# Patient Record
Sex: Male | Born: 1935 | Race: White | Hispanic: No | Marital: Married | Smoking: Former smoker
Health system: Southern US, Community
[De-identification: ages and names within clinical notes are randomized; demographics above are authoritative.]

## PROBLEM LIST (undated history)

## (undated) DIAGNOSIS — I714 Abdominal aortic aneurysm, without rupture, unspecified: Secondary | ICD-10-CM

## (undated) DIAGNOSIS — Z7901 Long term (current) use of anticoagulants: Secondary | ICD-10-CM

## (undated) DIAGNOSIS — I10 Essential (primary) hypertension: Secondary | ICD-10-CM

## (undated) DIAGNOSIS — M069 Rheumatoid arthritis, unspecified: Secondary | ICD-10-CM

## (undated) DIAGNOSIS — I4821 Permanent atrial fibrillation: Secondary | ICD-10-CM

## (undated) DIAGNOSIS — K259 Gastric ulcer, unspecified as acute or chronic, without hemorrhage or perforation: Secondary | ICD-10-CM

## (undated) DIAGNOSIS — I099 Rheumatic heart disease, unspecified: Secondary | ICD-10-CM

## (undated) DIAGNOSIS — K8309 Other cholangitis: Secondary | ICD-10-CM

## (undated) DIAGNOSIS — I639 Cerebral infarction, unspecified: Secondary | ICD-10-CM

## (undated) DIAGNOSIS — I519 Heart disease, unspecified: Secondary | ICD-10-CM

## (undated) HISTORY — DX: Essential (primary) hypertension: I10

## (undated) HISTORY — DX: Abdominal aortic aneurysm, without rupture, unspecified: I71.40

## (undated) HISTORY — DX: Other cholangitis: K83.09

## (undated) HISTORY — DX: Permanent atrial fibrillation: I48.21

## (undated) HISTORY — DX: Long term (current) use of anticoagulants: Z79.01

## (undated) HISTORY — DX: Gastric ulcer, unspecified as acute or chronic, without hemorrhage or perforation: K25.9

## (undated) HISTORY — DX: Abdominal aortic aneurysm, without rupture: I71.4

## (undated) HISTORY — DX: Rheumatic heart disease, unspecified: I09.9

## (undated) HISTORY — DX: Rheumatoid arthritis, unspecified: M06.9

## (undated) HISTORY — DX: Heart disease, unspecified: I51.9

---

## 1999-06-01 ENCOUNTER — Inpatient Hospital Stay (HOSPITAL_COMMUNITY): Admission: EM | Admit: 1999-06-01 | Discharge: 1999-06-04 | Payer: Self-pay | Admitting: Emergency Medicine

## 1999-06-01 ENCOUNTER — Encounter: Payer: Self-pay | Admitting: Emergency Medicine

## 1999-07-30 ENCOUNTER — Ambulatory Visit (HOSPITAL_COMMUNITY): Admission: RE | Admit: 1999-07-30 | Discharge: 1999-07-30 | Payer: Self-pay

## 2001-08-06 ENCOUNTER — Ambulatory Visit (HOSPITAL_COMMUNITY): Admission: RE | Admit: 2001-08-06 | Discharge: 2001-08-06 | Payer: Self-pay | Admitting: *Deleted

## 2001-08-06 ENCOUNTER — Encounter: Payer: Self-pay | Admitting: *Deleted

## 2001-08-09 ENCOUNTER — Ambulatory Visit (HOSPITAL_COMMUNITY): Admission: RE | Admit: 2001-08-09 | Discharge: 2001-08-09 | Payer: Self-pay | Admitting: *Deleted

## 2001-09-06 ENCOUNTER — Ambulatory Visit (HOSPITAL_COMMUNITY): Admission: RE | Admit: 2001-09-06 | Discharge: 2001-09-06 | Payer: Self-pay | Admitting: Cardiology

## 2002-05-13 ENCOUNTER — Ambulatory Visit (HOSPITAL_COMMUNITY): Admission: RE | Admit: 2002-05-13 | Discharge: 2002-05-13 | Payer: Self-pay | Admitting: *Deleted

## 2002-05-13 ENCOUNTER — Encounter: Payer: Self-pay | Admitting: *Deleted

## 2002-09-07 ENCOUNTER — Encounter: Payer: Self-pay | Admitting: Internal Medicine

## 2002-09-07 ENCOUNTER — Ambulatory Visit (HOSPITAL_COMMUNITY): Admission: RE | Admit: 2002-09-07 | Discharge: 2002-09-07 | Payer: Self-pay | Admitting: Internal Medicine

## 2002-09-15 ENCOUNTER — Ambulatory Visit (HOSPITAL_COMMUNITY): Admission: RE | Admit: 2002-09-15 | Discharge: 2002-09-15 | Payer: Self-pay | Admitting: Internal Medicine

## 2002-09-15 ENCOUNTER — Encounter: Payer: Self-pay | Admitting: Internal Medicine

## 2003-01-10 ENCOUNTER — Ambulatory Visit (HOSPITAL_COMMUNITY): Admission: RE | Admit: 2003-01-10 | Discharge: 2003-01-10 | Payer: Self-pay | Admitting: Internal Medicine

## 2003-01-10 ENCOUNTER — Encounter: Payer: Self-pay | Admitting: Internal Medicine

## 2003-07-17 ENCOUNTER — Encounter: Payer: Self-pay | Admitting: Internal Medicine

## 2003-07-17 ENCOUNTER — Ambulatory Visit (HOSPITAL_COMMUNITY): Admission: RE | Admit: 2003-07-17 | Discharge: 2003-07-17 | Payer: Self-pay | Admitting: Internal Medicine

## 2003-08-11 ENCOUNTER — Ambulatory Visit (HOSPITAL_COMMUNITY): Admission: RE | Admit: 2003-08-11 | Discharge: 2003-08-11 | Payer: Self-pay | Admitting: *Deleted

## 2003-08-11 ENCOUNTER — Encounter: Payer: Self-pay | Admitting: *Deleted

## 2003-11-02 ENCOUNTER — Ambulatory Visit (HOSPITAL_COMMUNITY): Admission: RE | Admit: 2003-11-02 | Discharge: 2003-11-02 | Payer: Self-pay | Admitting: Internal Medicine

## 2004-05-08 ENCOUNTER — Ambulatory Visit (HOSPITAL_COMMUNITY): Admission: RE | Admit: 2004-05-08 | Discharge: 2004-05-08 | Payer: Self-pay | Admitting: Internal Medicine

## 2004-06-17 ENCOUNTER — Ambulatory Visit (HOSPITAL_COMMUNITY): Admission: RE | Admit: 2004-06-17 | Discharge: 2004-06-17 | Payer: Self-pay | Admitting: *Deleted

## 2004-10-16 ENCOUNTER — Ambulatory Visit: Payer: Self-pay | Admitting: *Deleted

## 2004-11-12 ENCOUNTER — Ambulatory Visit (HOSPITAL_COMMUNITY): Admission: RE | Admit: 2004-11-12 | Discharge: 2004-11-12 | Payer: Self-pay | Admitting: Internal Medicine

## 2004-11-15 ENCOUNTER — Ambulatory Visit: Payer: Self-pay | Admitting: *Deleted

## 2004-11-26 ENCOUNTER — Ambulatory Visit: Payer: Self-pay | Admitting: *Deleted

## 2004-12-18 ENCOUNTER — Ambulatory Visit: Payer: Self-pay | Admitting: *Deleted

## 2004-12-30 ENCOUNTER — Ambulatory Visit (HOSPITAL_COMMUNITY): Admission: RE | Admit: 2004-12-30 | Discharge: 2004-12-30 | Payer: Self-pay | Admitting: Ophthalmology

## 2005-01-15 ENCOUNTER — Ambulatory Visit: Payer: Self-pay | Admitting: *Deleted

## 2005-02-12 ENCOUNTER — Ambulatory Visit: Payer: Self-pay | Admitting: Cardiovascular Disease

## 2005-03-18 ENCOUNTER — Ambulatory Visit: Payer: Self-pay | Admitting: *Deleted

## 2005-04-09 ENCOUNTER — Ambulatory Visit: Payer: Self-pay | Admitting: *Deleted

## 2005-04-30 ENCOUNTER — Ambulatory Visit: Payer: Self-pay | Admitting: *Deleted

## 2005-06-02 ENCOUNTER — Ambulatory Visit: Payer: Self-pay | Admitting: Cardiology

## 2005-07-01 ENCOUNTER — Ambulatory Visit: Payer: Self-pay | Admitting: *Deleted

## 2005-07-30 ENCOUNTER — Ambulatory Visit: Payer: Self-pay | Admitting: Cardiology

## 2005-08-27 ENCOUNTER — Ambulatory Visit: Payer: Self-pay | Admitting: *Deleted

## 2005-08-28 ENCOUNTER — Ambulatory Visit: Payer: Self-pay | Admitting: Cardiology

## 2005-08-28 ENCOUNTER — Ambulatory Visit (HOSPITAL_COMMUNITY): Admission: RE | Admit: 2005-08-28 | Discharge: 2005-08-28 | Payer: Self-pay | Admitting: *Deleted

## 2005-10-07 ENCOUNTER — Ambulatory Visit: Payer: Self-pay | Admitting: *Deleted

## 2005-11-04 ENCOUNTER — Ambulatory Visit: Payer: Self-pay | Admitting: *Deleted

## 2005-12-08 ENCOUNTER — Ambulatory Visit: Payer: Self-pay | Admitting: *Deleted

## 2006-01-07 ENCOUNTER — Ambulatory Visit: Payer: Self-pay | Admitting: *Deleted

## 2006-01-28 ENCOUNTER — Ambulatory Visit: Payer: Self-pay | Admitting: *Deleted

## 2006-02-26 ENCOUNTER — Ambulatory Visit: Payer: Self-pay | Admitting: *Deleted

## 2006-03-31 ENCOUNTER — Ambulatory Visit: Payer: Self-pay | Admitting: *Deleted

## 2006-04-29 ENCOUNTER — Ambulatory Visit: Payer: Self-pay | Admitting: *Deleted

## 2006-05-29 ENCOUNTER — Ambulatory Visit: Payer: Self-pay | Admitting: *Deleted

## 2006-06-30 ENCOUNTER — Ambulatory Visit: Payer: Self-pay | Admitting: *Deleted

## 2006-08-04 ENCOUNTER — Ambulatory Visit: Payer: Self-pay | Admitting: Cardiology

## 2006-08-19 ENCOUNTER — Ambulatory Visit: Payer: Self-pay | Admitting: Cardiology

## 2006-09-22 ENCOUNTER — Ambulatory Visit: Payer: Self-pay | Admitting: *Deleted

## 2006-10-23 ENCOUNTER — Ambulatory Visit: Payer: Self-pay | Admitting: Cardiology

## 2006-10-23 ENCOUNTER — Ambulatory Visit (HOSPITAL_COMMUNITY): Admission: RE | Admit: 2006-10-23 | Discharge: 2006-10-23 | Payer: Self-pay | Admitting: *Deleted

## 2006-11-20 ENCOUNTER — Ambulatory Visit: Payer: Self-pay | Admitting: Cardiology

## 2006-12-29 ENCOUNTER — Ambulatory Visit: Payer: Self-pay | Admitting: Cardiology

## 2007-01-29 ENCOUNTER — Ambulatory Visit: Payer: Self-pay | Admitting: Cardiology

## 2007-02-12 ENCOUNTER — Ambulatory Visit: Payer: Self-pay | Admitting: Internal Medicine

## 2007-03-03 ENCOUNTER — Ambulatory Visit: Payer: Self-pay | Admitting: *Deleted

## 2007-03-10 ENCOUNTER — Ambulatory Visit: Payer: Self-pay | Admitting: Cardiology

## 2007-03-25 ENCOUNTER — Ambulatory Visit: Payer: Self-pay | Admitting: Cardiology

## 2007-04-08 ENCOUNTER — Ambulatory Visit: Payer: Self-pay | Admitting: Cardiology

## 2007-04-22 ENCOUNTER — Ambulatory Visit: Payer: Self-pay | Admitting: Cardiology

## 2007-05-27 ENCOUNTER — Ambulatory Visit: Payer: Self-pay | Admitting: Internal Medicine

## 2007-06-10 ENCOUNTER — Ambulatory Visit: Payer: Self-pay | Admitting: Physician Assistant

## 2007-06-24 ENCOUNTER — Ambulatory Visit: Payer: Self-pay | Admitting: Cardiology

## 2007-07-13 ENCOUNTER — Ambulatory Visit (HOSPITAL_COMMUNITY): Admission: RE | Admit: 2007-07-13 | Discharge: 2007-07-13 | Payer: Self-pay | Admitting: Internal Medicine

## 2007-07-20 ENCOUNTER — Ambulatory Visit: Payer: Self-pay | Admitting: Cardiology

## 2007-08-03 ENCOUNTER — Ambulatory Visit: Payer: Self-pay | Admitting: Cardiology

## 2007-08-11 ENCOUNTER — Ambulatory Visit: Payer: Self-pay | Admitting: Cardiology

## 2007-08-25 ENCOUNTER — Ambulatory Visit: Payer: Self-pay | Admitting: Cardiology

## 2007-09-08 ENCOUNTER — Ambulatory Visit: Payer: Self-pay | Admitting: Cardiology

## 2007-09-10 ENCOUNTER — Ambulatory Visit (HOSPITAL_COMMUNITY): Admission: RE | Admit: 2007-09-10 | Discharge: 2007-09-10 | Payer: Self-pay | Admitting: Internal Medicine

## 2007-09-29 ENCOUNTER — Ambulatory Visit: Payer: Self-pay | Admitting: Cardiology

## 2007-10-20 ENCOUNTER — Ambulatory Visit: Payer: Self-pay | Admitting: Cardiology

## 2007-11-17 ENCOUNTER — Ambulatory Visit: Payer: Self-pay | Admitting: Cardiology

## 2007-12-15 ENCOUNTER — Ambulatory Visit: Payer: Self-pay | Admitting: Cardiology

## 2007-12-17 ENCOUNTER — Ambulatory Visit: Payer: Self-pay | Admitting: Cardiology

## 2008-01-03 ENCOUNTER — Ambulatory Visit: Payer: Self-pay | Admitting: Cardiology

## 2008-01-11 ENCOUNTER — Ambulatory Visit: Payer: Self-pay | Admitting: Cardiology

## 2008-01-28 ENCOUNTER — Ambulatory Visit: Payer: Self-pay | Admitting: Cardiology

## 2008-02-08 ENCOUNTER — Ambulatory Visit: Payer: Self-pay | Admitting: Cardiology

## 2008-02-15 ENCOUNTER — Ambulatory Visit: Payer: Self-pay | Admitting: Cardiology

## 2008-02-29 ENCOUNTER — Ambulatory Visit: Payer: Self-pay | Admitting: Cardiology

## 2008-03-13 ENCOUNTER — Ambulatory Visit: Payer: Self-pay | Admitting: Cardiology

## 2008-03-14 ENCOUNTER — Ambulatory Visit: Payer: Self-pay | Admitting: Cardiology

## 2008-03-15 ENCOUNTER — Encounter: Payer: Self-pay | Admitting: Cardiology

## 2008-03-20 ENCOUNTER — Ambulatory Visit: Payer: Self-pay | Admitting: Cardiology

## 2008-03-24 ENCOUNTER — Encounter: Payer: Self-pay | Admitting: Cardiology

## 2008-03-27 ENCOUNTER — Ambulatory Visit: Payer: Self-pay | Admitting: Cardiology

## 2008-03-28 ENCOUNTER — Encounter: Payer: Self-pay | Admitting: Cardiology

## 2008-04-04 ENCOUNTER — Ambulatory Visit: Payer: Self-pay | Admitting: Cardiology

## 2008-04-07 ENCOUNTER — Ambulatory Visit: Payer: Self-pay | Admitting: Cardiology

## 2008-04-07 ENCOUNTER — Encounter: Payer: Self-pay | Admitting: Physician Assistant

## 2008-04-10 ENCOUNTER — Ambulatory Visit: Payer: Self-pay | Admitting: Cardiology

## 2008-04-20 ENCOUNTER — Ambulatory Visit: Payer: Self-pay | Admitting: Cardiology

## 2008-04-28 ENCOUNTER — Ambulatory Visit: Payer: Self-pay | Admitting: Cardiology

## 2008-05-05 ENCOUNTER — Ambulatory Visit: Payer: Self-pay | Admitting: Cardiology

## 2008-05-12 ENCOUNTER — Ambulatory Visit: Payer: Self-pay | Admitting: Cardiology

## 2008-05-19 ENCOUNTER — Ambulatory Visit: Payer: Self-pay | Admitting: Cardiology

## 2008-06-01 ENCOUNTER — Ambulatory Visit: Payer: Self-pay | Admitting: Cardiology

## 2008-06-12 ENCOUNTER — Encounter: Payer: Self-pay | Admitting: Cardiology

## 2008-06-23 ENCOUNTER — Ambulatory Visit: Payer: Self-pay | Admitting: Cardiology

## 2008-07-04 ENCOUNTER — Ambulatory Visit: Payer: Self-pay | Admitting: Cardiology

## 2008-07-11 ENCOUNTER — Ambulatory Visit: Payer: Self-pay | Admitting: Cardiology

## 2008-07-28 ENCOUNTER — Ambulatory Visit: Payer: Self-pay | Admitting: Cardiology

## 2008-08-02 ENCOUNTER — Ambulatory Visit: Payer: Self-pay | Admitting: Cardiology

## 2008-08-16 ENCOUNTER — Ambulatory Visit: Payer: Self-pay | Admitting: Cardiology

## 2008-09-01 ENCOUNTER — Ambulatory Visit: Payer: Self-pay | Admitting: Cardiology

## 2008-09-04 ENCOUNTER — Ambulatory Visit: Payer: Self-pay | Admitting: Cardiology

## 2008-09-06 ENCOUNTER — Ambulatory Visit: Payer: Self-pay | Admitting: Cardiology

## 2008-09-08 ENCOUNTER — Ambulatory Visit: Payer: Self-pay | Admitting: Cardiology

## 2008-09-15 ENCOUNTER — Ambulatory Visit: Payer: Self-pay | Admitting: Cardiology

## 2008-09-29 ENCOUNTER — Ambulatory Visit: Payer: Self-pay | Admitting: Cardiology

## 2008-10-10 ENCOUNTER — Ambulatory Visit: Payer: Self-pay | Admitting: Cardiology

## 2008-10-16 ENCOUNTER — Encounter: Payer: Self-pay | Admitting: Cardiology

## 2008-10-16 ENCOUNTER — Ambulatory Visit: Payer: Self-pay | Admitting: Cardiology

## 2008-10-16 DIAGNOSIS — F411 Generalized anxiety disorder: Secondary | ICD-10-CM | POA: Insufficient documentation

## 2008-10-16 DIAGNOSIS — Z8679 Personal history of other diseases of the circulatory system: Secondary | ICD-10-CM

## 2008-10-16 DIAGNOSIS — I099 Rheumatic heart disease, unspecified: Secondary | ICD-10-CM

## 2008-10-16 DIAGNOSIS — Z954 Presence of other heart-valve replacement: Secondary | ICD-10-CM

## 2008-10-16 DIAGNOSIS — F3289 Other specified depressive episodes: Secondary | ICD-10-CM

## 2008-10-16 DIAGNOSIS — I509 Heart failure, unspecified: Secondary | ICD-10-CM | POA: Insufficient documentation

## 2008-10-16 DIAGNOSIS — Z9889 Other specified postprocedural states: Secondary | ICD-10-CM | POA: Insufficient documentation

## 2008-10-16 DIAGNOSIS — I6350 Cerebral infarction due to unspecified occlusion or stenosis of unspecified cerebral artery: Secondary | ICD-10-CM | POA: Insufficient documentation

## 2008-10-24 ENCOUNTER — Ambulatory Visit: Payer: Self-pay | Admitting: Cardiology

## 2008-11-07 ENCOUNTER — Ambulatory Visit: Payer: Self-pay | Admitting: Cardiology

## 2008-11-21 ENCOUNTER — Ambulatory Visit: Payer: Self-pay | Admitting: Cardiology

## 2008-12-05 ENCOUNTER — Ambulatory Visit: Payer: Self-pay | Admitting: Cardiology

## 2008-12-19 ENCOUNTER — Ambulatory Visit: Payer: Self-pay | Admitting: Cardiology

## 2009-01-09 ENCOUNTER — Ambulatory Visit: Payer: Self-pay | Admitting: Cardiology

## 2009-01-23 ENCOUNTER — Ambulatory Visit: Payer: Self-pay | Admitting: Cardiology

## 2009-02-06 ENCOUNTER — Ambulatory Visit: Payer: Self-pay | Admitting: Cardiology

## 2009-02-23 ENCOUNTER — Ambulatory Visit: Payer: Self-pay | Admitting: Cardiology

## 2009-03-09 ENCOUNTER — Ambulatory Visit: Payer: Self-pay | Admitting: Cardiology

## 2009-03-14 ENCOUNTER — Encounter: Payer: Self-pay | Admitting: Cardiology

## 2009-03-23 ENCOUNTER — Ambulatory Visit: Payer: Self-pay | Admitting: Cardiology

## 2009-03-27 ENCOUNTER — Ambulatory Visit: Payer: Self-pay | Admitting: Cardiology

## 2009-03-30 ENCOUNTER — Ambulatory Visit: Payer: Self-pay | Admitting: Cardiology

## 2009-04-03 ENCOUNTER — Ambulatory Visit: Payer: Self-pay | Admitting: Cardiology

## 2009-04-10 ENCOUNTER — Ambulatory Visit: Payer: Self-pay | Admitting: Cardiology

## 2009-04-26 ENCOUNTER — Ambulatory Visit: Payer: Self-pay | Admitting: Cardiology

## 2009-05-08 ENCOUNTER — Ambulatory Visit: Payer: Self-pay | Admitting: Cardiology

## 2009-05-25 ENCOUNTER — Ambulatory Visit: Payer: Self-pay

## 2009-06-05 ENCOUNTER — Ambulatory Visit: Payer: Self-pay | Admitting: Cardiology

## 2009-06-26 ENCOUNTER — Ambulatory Visit: Payer: Self-pay | Admitting: Cardiology

## 2009-07-10 ENCOUNTER — Ambulatory Visit: Payer: Self-pay | Admitting: Cardiology

## 2009-07-16 ENCOUNTER — Encounter: Payer: Self-pay | Admitting: *Deleted

## 2009-07-17 ENCOUNTER — Ambulatory Visit: Payer: Self-pay | Admitting: Cardiology

## 2009-07-17 LAB — CONVERTED CEMR LAB: Prothrombin Time: 23.8 s

## 2009-07-31 ENCOUNTER — Ambulatory Visit: Payer: Self-pay | Admitting: Cardiology

## 2009-08-14 ENCOUNTER — Ambulatory Visit: Payer: Self-pay | Admitting: Cardiology

## 2009-08-14 LAB — CONVERTED CEMR LAB: POC INR: 4.4

## 2009-08-28 ENCOUNTER — Ambulatory Visit: Payer: Self-pay | Admitting: Cardiology

## 2009-08-28 LAB — CONVERTED CEMR LAB: POC INR: 4.9

## 2009-09-11 ENCOUNTER — Ambulatory Visit: Payer: Self-pay | Admitting: Cardiology

## 2009-09-11 LAB — CONVERTED CEMR LAB: POC INR: 4.6

## 2009-09-28 ENCOUNTER — Ambulatory Visit: Payer: Self-pay | Admitting: Cardiology

## 2009-09-28 ENCOUNTER — Encounter: Payer: Self-pay | Admitting: Physician Assistant

## 2009-10-12 ENCOUNTER — Ambulatory Visit: Payer: Self-pay | Admitting: Cardiology

## 2009-10-12 LAB — CONVERTED CEMR LAB: POC INR: 3.7

## 2009-10-30 ENCOUNTER — Ambulatory Visit: Payer: Self-pay | Admitting: Cardiology

## 2009-10-30 LAB — CONVERTED CEMR LAB: POC INR: 3.3

## 2009-11-09 ENCOUNTER — Ambulatory Visit: Payer: Self-pay | Admitting: Cardiology

## 2009-11-12 ENCOUNTER — Ambulatory Visit: Payer: Self-pay | Admitting: Cardiology

## 2009-11-27 ENCOUNTER — Ambulatory Visit: Payer: Self-pay | Admitting: Cardiology

## 2009-12-18 ENCOUNTER — Ambulatory Visit: Payer: Self-pay | Admitting: Cardiology

## 2009-12-18 LAB — CONVERTED CEMR LAB: POC INR: 4.6

## 2010-01-04 ENCOUNTER — Ambulatory Visit: Payer: Self-pay | Admitting: Cardiology

## 2010-01-25 ENCOUNTER — Ambulatory Visit: Payer: Self-pay | Admitting: Cardiology

## 2010-02-08 ENCOUNTER — Encounter (INDEPENDENT_AMBULATORY_CARE_PROVIDER_SITE_OTHER): Payer: Self-pay | Admitting: *Deleted

## 2010-02-08 ENCOUNTER — Ambulatory Visit: Payer: Self-pay | Admitting: Cardiology

## 2010-02-08 ENCOUNTER — Encounter: Payer: Self-pay | Admitting: Cardiology

## 2010-02-08 LAB — CONVERTED CEMR LAB: POC INR: 4.1

## 2010-02-18 ENCOUNTER — Ambulatory Visit: Payer: Self-pay | Admitting: Cardiology

## 2010-02-18 ENCOUNTER — Encounter: Payer: Self-pay | Admitting: Cardiology

## 2010-03-01 ENCOUNTER — Ambulatory Visit: Payer: Self-pay | Admitting: Cardiology

## 2010-03-01 LAB — CONVERTED CEMR LAB: POC INR: 3.9

## 2010-03-19 ENCOUNTER — Ambulatory Visit: Payer: Self-pay | Admitting: Cardiology

## 2010-03-19 LAB — CONVERTED CEMR LAB: POC INR: 3.7

## 2010-04-09 ENCOUNTER — Ambulatory Visit: Payer: Self-pay | Admitting: Cardiology

## 2010-04-30 ENCOUNTER — Ambulatory Visit: Payer: Self-pay | Admitting: Cardiology

## 2010-04-30 LAB — CONVERTED CEMR LAB: POC INR: 3.1

## 2010-05-08 ENCOUNTER — Ambulatory Visit: Payer: Self-pay | Admitting: Cardiology

## 2010-05-10 ENCOUNTER — Encounter: Payer: Self-pay | Admitting: Cardiology

## 2010-05-17 ENCOUNTER — Ambulatory Visit: Payer: Self-pay | Admitting: Cardiology

## 2010-05-17 LAB — CONVERTED CEMR LAB: POC INR: 3.2

## 2010-05-31 ENCOUNTER — Ambulatory Visit: Payer: Self-pay | Admitting: Cardiology

## 2010-06-21 ENCOUNTER — Ambulatory Visit: Payer: Self-pay | Admitting: Cardiology

## 2010-06-21 ENCOUNTER — Telehealth: Payer: Self-pay | Admitting: Adult Health

## 2010-06-25 ENCOUNTER — Ambulatory Visit: Payer: Self-pay | Admitting: Cardiology

## 2010-06-25 LAB — CONVERTED CEMR LAB: POC INR: 3.2

## 2010-07-01 ENCOUNTER — Telehealth (INDEPENDENT_AMBULATORY_CARE_PROVIDER_SITE_OTHER): Payer: Self-pay | Admitting: *Deleted

## 2010-07-02 ENCOUNTER — Ambulatory Visit (HOSPITAL_COMMUNITY): Admission: RE | Admit: 2010-07-02 | Discharge: 2010-07-02 | Payer: Self-pay | Admitting: Internal Medicine

## 2010-07-02 ENCOUNTER — Encounter: Payer: Self-pay | Admitting: Cardiology

## 2010-07-03 ENCOUNTER — Ambulatory Visit: Payer: Self-pay | Admitting: Cardiology

## 2010-07-03 DIAGNOSIS — R079 Chest pain, unspecified: Secondary | ICD-10-CM | POA: Insufficient documentation

## 2010-07-03 DIAGNOSIS — R310 Gross hematuria: Secondary | ICD-10-CM | POA: Insufficient documentation

## 2010-07-12 ENCOUNTER — Ambulatory Visit: Payer: Self-pay | Admitting: Cardiology

## 2010-07-12 LAB — CONVERTED CEMR LAB: POC INR: 3.3

## 2010-07-26 ENCOUNTER — Ambulatory Visit: Payer: Self-pay | Admitting: Cardiology

## 2010-08-16 ENCOUNTER — Ambulatory Visit: Payer: Self-pay | Admitting: Cardiology

## 2010-08-16 LAB — CONVERTED CEMR LAB: POC INR: 4.7

## 2010-08-30 ENCOUNTER — Ambulatory Visit: Payer: Self-pay | Admitting: Cardiology

## 2010-08-30 LAB — CONVERTED CEMR LAB: POC INR: 4.6

## 2010-09-17 ENCOUNTER — Ambulatory Visit: Payer: Self-pay | Admitting: Cardiology

## 2010-09-17 LAB — CONVERTED CEMR LAB: POC INR: 5

## 2010-10-01 ENCOUNTER — Ambulatory Visit: Payer: Self-pay | Admitting: Cardiology

## 2010-10-22 ENCOUNTER — Ambulatory Visit: Payer: Self-pay | Admitting: Cardiology

## 2010-11-08 ENCOUNTER — Ambulatory Visit: Payer: Self-pay | Admitting: Cardiology

## 2010-11-08 LAB — CONVERTED CEMR LAB: POC INR: 4.8

## 2010-11-29 ENCOUNTER — Ambulatory Visit: Payer: Self-pay | Admitting: Cardiology

## 2010-11-29 LAB — CONVERTED CEMR LAB: POC INR: 7.6

## 2010-12-03 ENCOUNTER — Ambulatory Visit: Admission: RE | Admit: 2010-12-03 | Discharge: 2010-12-03 | Payer: Self-pay | Source: Home / Self Care

## 2010-12-04 ENCOUNTER — Telehealth (INDEPENDENT_AMBULATORY_CARE_PROVIDER_SITE_OTHER): Payer: Self-pay | Admitting: *Deleted

## 2010-12-04 ENCOUNTER — Encounter: Payer: Self-pay | Admitting: Cardiology

## 2010-12-07 ENCOUNTER — Encounter: Payer: Self-pay | Admitting: Cardiology

## 2010-12-09 ENCOUNTER — Ambulatory Visit: Admit: 2010-12-09 | Payer: Self-pay

## 2010-12-10 ENCOUNTER — Ambulatory Visit
Admission: RE | Admit: 2010-12-10 | Discharge: 2010-12-10 | Payer: Self-pay | Source: Home / Self Care | Attending: Cardiology | Admitting: Cardiology

## 2010-12-10 LAB — CONVERTED CEMR LAB: POC INR: 3.8

## 2010-12-12 ENCOUNTER — Ambulatory Visit: Admit: 2010-12-12 | Payer: Self-pay

## 2010-12-13 ENCOUNTER — Ambulatory Visit: Admit: 2010-12-13 | Payer: Self-pay

## 2010-12-13 ENCOUNTER — Encounter (INDEPENDENT_AMBULATORY_CARE_PROVIDER_SITE_OTHER): Payer: Self-pay | Admitting: *Deleted

## 2010-12-13 ENCOUNTER — Encounter: Payer: Self-pay | Admitting: Cardiology

## 2010-12-13 ENCOUNTER — Inpatient Hospital Stay (HOSPITAL_COMMUNITY)
Admission: EM | Admit: 2010-12-13 | Discharge: 2010-12-21 | Payer: Self-pay | Source: Home / Self Care | Attending: Internal Medicine | Admitting: Internal Medicine

## 2010-12-16 ENCOUNTER — Ambulatory Visit: Admit: 2010-12-16 | Payer: Self-pay | Admitting: Internal Medicine

## 2010-12-16 LAB — DIFFERENTIAL
Basophils Absolute: 0 10*3/uL (ref 0.0–0.1)
Basophils Absolute: 0 10*3/uL (ref 0.0–0.1)
Basophils Relative: 0 % (ref 0–1)
Basophils Relative: 0 % (ref 0–1)
Eosinophils Absolute: 0.2 10*3/uL (ref 0.0–0.7)
Eosinophils Absolute: 0.6 10*3/uL (ref 0.0–0.7)
Eosinophils Relative: 1 % (ref 0–5)
Eosinophils Relative: 4 % (ref 0–5)
Lymphocytes Relative: 3 % — ABNORMAL LOW (ref 12–46)
Lymphocytes Relative: 5 % — ABNORMAL LOW (ref 12–46)
Lymphs Abs: 0.6 10*3/uL — ABNORMAL LOW (ref 0.7–4.0)
Lymphs Abs: 0.7 10*3/uL (ref 0.7–4.0)
Monocytes Absolute: 0.9 10*3/uL (ref 0.1–1.0)
Monocytes Absolute: 0.9 10*3/uL (ref 0.1–1.0)
Monocytes Relative: 5 % (ref 3–12)
Monocytes Relative: 7 % (ref 3–12)
Neutro Abs: 17.2 10*3/uL — ABNORMAL HIGH (ref 1.7–7.7)
Neutro Abs: 11.9 10*3/uL — ABNORMAL HIGH (ref 1.7–7.7)
Neutrophils Relative %: 91 % — ABNORMAL HIGH (ref 43–77)
Neutrophils Relative %: 84 % — ABNORMAL HIGH (ref 43–77)

## 2010-12-16 LAB — CBC
HCT: 36.3 % — ABNORMAL LOW (ref 39.0–52.0)
HCT: 33.6 % — ABNORMAL LOW (ref 39.0–52.0)
HCT: 34.3 % — ABNORMAL LOW (ref 39.0–52.0)
Hemoglobin: 11.9 g/dL — ABNORMAL LOW (ref 13.0–17.0)
Hemoglobin: 11 g/dL — ABNORMAL LOW (ref 13.0–17.0)
Hemoglobin: 11 g/dL — ABNORMAL LOW (ref 13.0–17.0)
MCH: 31 pg (ref 26.0–34.0)
MCH: 31.1 pg (ref 26.0–34.0)
MCH: 30.7 pg (ref 26.0–34.0)
MCHC: 32.8 g/dL (ref 30.0–36.0)
MCHC: 32.7 g/dL (ref 30.0–36.0)
MCHC: 32.1 g/dL (ref 30.0–36.0)
MCV: 94.5 fL (ref 78.0–100.0)
MCV: 94.9 fL (ref 78.0–100.0)
MCV: 95.8 fL (ref 78.0–100.0)
Platelets: 271 10*3/uL (ref 150–400)
Platelets: 268 10*3/uL (ref 150–400)
Platelets: 305 10*3/uL (ref 150–400)
RBC: 3.84 MIL/uL — ABNORMAL LOW (ref 4.22–5.81)
RBC: 3.54 MIL/uL — ABNORMAL LOW (ref 4.22–5.81)
RBC: 3.58 MIL/uL — ABNORMAL LOW (ref 4.22–5.81)
RDW: 14.3 % (ref 11.5–15.5)
RDW: 14.4 % (ref 11.5–15.5)
RDW: 14.3 % (ref 11.5–15.5)
WBC: 18.8 10*3/uL — ABNORMAL HIGH (ref 4.0–10.5)
WBC: 14.1 10*3/uL — ABNORMAL HIGH (ref 4.0–10.5)
WBC: 8.6 10*3/uL (ref 4.0–10.5)

## 2010-12-16 LAB — COMPREHENSIVE METABOLIC PANEL
ALT: 104 U/L — ABNORMAL HIGH (ref 0–53)
ALT: 80 U/L — ABNORMAL HIGH (ref 0–53)
ALT: 60 U/L — ABNORMAL HIGH (ref 0–53)
AST: 152 U/L — ABNORMAL HIGH (ref 0–37)
AST: 100 U/L — ABNORMAL HIGH (ref 0–37)
AST: 55 U/L — ABNORMAL HIGH (ref 0–37)
Albumin: 3.3 g/dL — ABNORMAL LOW (ref 3.5–5.2)
Albumin: 3 g/dL — ABNORMAL LOW (ref 3.5–5.2)
Albumin: 3 g/dL — ABNORMAL LOW (ref 3.5–5.2)
Alkaline Phosphatase: 195 U/L — ABNORMAL HIGH (ref 39–117)
Alkaline Phosphatase: 157 U/L — ABNORMAL HIGH (ref 39–117)
Alkaline Phosphatase: 132 U/L — ABNORMAL HIGH (ref 39–117)
BUN: 14 mg/dL (ref 6–23)
BUN: 10 mg/dL (ref 6–23)
BUN: 6 mg/dL (ref 6–23)
CO2: 23 mEq/L (ref 19–32)
CO2: 24 mEq/L (ref 19–32)
CO2: 25 mEq/L (ref 19–32)
Calcium: 9.8 mg/dL (ref 8.4–10.5)
Calcium: 9.5 mg/dL (ref 8.4–10.5)
Calcium: 9.6 mg/dL (ref 8.4–10.5)
Chloride: 99 mEq/L (ref 96–112)
Chloride: 102 mEq/L (ref 96–112)
Chloride: 106 mEq/L (ref 96–112)
Creatinine, Ser: 0.91 mg/dL (ref 0.4–1.5)
Creatinine, Ser: 0.75 mg/dL (ref 0.4–1.5)
Creatinine, Ser: 0.74 mg/dL (ref 0.4–1.5)
GFR calc Af Amer: >60 mL/min (ref >60)
GFR calc Af Amer: >60 mL/min (ref >60)
GFR calc Af Amer: >60 mL/min (ref >60)
GFR calc non Af Amer: >60 mL/min (ref >60)
GFR calc non Af Amer: >60 mL/min (ref >60)
GFR calc non Af Amer: >60 mL/min (ref >60)
Glucose, Bld: 150 mg/dL — ABNORMAL HIGH (ref 70–99)
Glucose, Bld: 107 mg/dL — ABNORMAL HIGH (ref 70–99)
Glucose, Bld: 102 mg/dL — ABNORMAL HIGH (ref 70–99)
Potassium: 4.3 mEq/L (ref 3.5–5.1)
Potassium: 4 mEq/L (ref 3.5–5.1)
Potassium: 4.3 mEq/L (ref 3.5–5.1)
Sodium: 131 mEq/L — ABNORMAL LOW (ref 135–145)
Sodium: 133 mEq/L — ABNORMAL LOW (ref 135–145)
Sodium: 137 mEq/L (ref 135–145)
Total Bilirubin: 2.4 mg/dL — ABNORMAL HIGH (ref 0.3–1.2)
Total Bilirubin: 1.7 mg/dL — ABNORMAL HIGH (ref 0.3–1.2)
Total Bilirubin: 1.2 mg/dL (ref 0.3–1.2)
Total Protein: 6.4 g/dL (ref 6.0–8.3)
Total Protein: 5.9 g/dL — ABNORMAL LOW (ref 6.0–8.3)
Total Protein: 5.8 g/dL — ABNORMAL LOW (ref 6.0–8.3)

## 2010-12-16 LAB — URINE CULTURE
Colony Count: NO GROWTH
Culture  Setup Time: 201201131750
Culture: NO GROWTH

## 2010-12-16 LAB — URINALYSIS, ROUTINE W REFLEX MICROSCOPIC
Ketones, ur: NEGATIVE mg/dL
Nitrite: NEGATIVE
Protein, ur: NEGATIVE mg/dL
Specific Gravity, Urine: 1.018 (ref 1.005–1.030)
Urine Glucose, Fasting: NEGATIVE mg/dL
Urobilinogen, UA: 0.2 mg/dL (ref 0.0–1.0)
pH: 5.5 (ref 5.0–8.0)

## 2010-12-16 LAB — URINE MICROSCOPIC-ADD ON

## 2010-12-16 LAB — GLUCOSE, CAPILLARY
Glucose-Capillary: 181 mg/dL — ABNORMAL HIGH (ref 70–99)
Glucose-Capillary: 129 mg/dL — ABNORMAL HIGH (ref 70–99)
Glucose-Capillary: 118 mg/dL — ABNORMAL HIGH (ref 70–99)
Glucose-Capillary: 131 mg/dL — ABNORMAL HIGH (ref 70–99)
Glucose-Capillary: 127 mg/dL — ABNORMAL HIGH (ref 70–99)
Glucose-Capillary: 109 mg/dL — ABNORMAL HIGH (ref 70–99)
Glucose-Capillary: 151 mg/dL — ABNORMAL HIGH (ref 70–99)
Glucose-Capillary: 142 mg/dL — ABNORMAL HIGH (ref 70–99)
Glucose-Capillary: 114 mg/dL — ABNORMAL HIGH (ref 70–99)

## 2010-12-16 LAB — PROTIME-INR
INR: 3 — ABNORMAL HIGH (ref 0.00–1.49)
INR: 2.88 — ABNORMAL HIGH (ref 0.00–1.49)
INR: 1.66 — ABNORMAL HIGH (ref 0.00–1.49)
Prothrombin Time: 31.2 seconds — ABNORMAL HIGH (ref 11.6–15.2)
Prothrombin Time: 30.2 seconds — ABNORMAL HIGH (ref 11.6–15.2)
Prothrombin Time: 19.8 seconds — ABNORMAL HIGH (ref 11.6–15.2)

## 2010-12-16 LAB — HEPARIN LEVEL (UNFRACTIONATED)
Heparin Unfractionated: <0.1 IU/mL — ABNORMAL LOW (ref 0.30–0.70)
Heparin Unfractionated: 0.12 IU/mL — ABNORMAL LOW (ref 0.30–0.70)
Heparin Unfractionated: 0.53 IU/mL (ref 0.30–0.70)

## 2010-12-17 ENCOUNTER — Encounter (INDEPENDENT_AMBULATORY_CARE_PROVIDER_SITE_OTHER): Payer: Self-pay | Admitting: Physician Assistant

## 2010-12-17 ENCOUNTER — Encounter: Payer: Self-pay | Admitting: Cardiology

## 2010-12-18 LAB — GLUCOSE, CAPILLARY
Glucose-Capillary: 100 mg/dL — ABNORMAL HIGH (ref 70–99)
Glucose-Capillary: 155 mg/dL — ABNORMAL HIGH (ref 70–99)
Glucose-Capillary: 164 mg/dL — ABNORMAL HIGH (ref 70–99)
Glucose-Capillary: 176 mg/dL — ABNORMAL HIGH (ref 70–99)
Glucose-Capillary: 94 mg/dL (ref 70–99)
Glucose-Capillary: 121 mg/dL — ABNORMAL HIGH (ref 70–99)
Glucose-Capillary: 147 mg/dL — ABNORMAL HIGH (ref 70–99)
Glucose-Capillary: 121 mg/dL — ABNORMAL HIGH (ref 70–99)

## 2010-12-18 LAB — COMPREHENSIVE METABOLIC PANEL
ALT: 50 U/L (ref 0–53)
ALT: 38 U/L (ref 0–53)
AST: 42 U/L — ABNORMAL HIGH (ref 0–37)
AST: 32 U/L (ref 0–37)
Albumin: 3.1 g/dL — ABNORMAL LOW (ref 3.5–5.2)
Albumin: 3.1 g/dL — ABNORMAL LOW (ref 3.5–5.2)
Alkaline Phosphatase: 125 U/L — ABNORMAL HIGH (ref 39–117)
Alkaline Phosphatase: 111 U/L (ref 39–117)
BUN: 9 mg/dL (ref 6–23)
BUN: 12 mg/dL (ref 6–23)
CO2: 26 mEq/L (ref 19–32)
CO2: 28 mEq/L (ref 19–32)
Calcium: 9.7 mg/dL (ref 8.4–10.5)
Calcium: 9.9 mg/dL (ref 8.4–10.5)
Chloride: 102 mEq/L (ref 96–112)
Chloride: 104 mEq/L (ref 96–112)
Creatinine, Ser: 0.79 mg/dL (ref 0.4–1.5)
Creatinine, Ser: 0.86 mg/dL (ref 0.4–1.5)
GFR calc Af Amer: >60 mL/min (ref >60)
GFR calc Af Amer: >60 mL/min (ref >60)
GFR calc non Af Amer: >60 mL/min (ref >60)
GFR calc non Af Amer: >60 mL/min (ref >60)
Glucose, Bld: 97 mg/dL (ref 70–99)
Glucose, Bld: 86 mg/dL (ref 70–99)
Potassium: 4.3 mEq/L (ref 3.5–5.1)
Potassium: 4.8 mEq/L (ref 3.5–5.1)
Sodium: 134 mEq/L — ABNORMAL LOW (ref 135–145)
Sodium: 137 mEq/L (ref 135–145)
Total Bilirubin: 1.1 mg/dL (ref 0.3–1.2)
Total Bilirubin: 1 mg/dL (ref 0.3–1.2)
Total Protein: 6.2 g/dL (ref 6.0–8.3)
Total Protein: 6.2 g/dL (ref 6.0–8.3)

## 2010-12-18 LAB — CBC
HCT: 34.9 % — ABNORMAL LOW (ref 39.0–52.0)
HCT: 34.7 % — ABNORMAL LOW (ref 39.0–52.0)
Hemoglobin: 11.4 g/dL — ABNORMAL LOW (ref 13.0–17.0)
Hemoglobin: 11.3 g/dL — ABNORMAL LOW (ref 13.0–17.0)
MCH: 31.3 pg (ref 26.0–34.0)
MCH: 31 pg (ref 26.0–34.0)
MCHC: 32.7 g/dL (ref 30.0–36.0)
MCHC: 32.6 g/dL (ref 30.0–36.0)
MCV: 95.9 fL (ref 78.0–100.0)
MCV: 95.1 fL (ref 78.0–100.0)
Platelets: 345 10*3/uL (ref 150–400)
Platelets: 366 10*3/uL (ref 150–400)
RBC: 3.64 MIL/uL — ABNORMAL LOW (ref 4.22–5.81)
RBC: 3.65 MIL/uL — ABNORMAL LOW (ref 4.22–5.81)
RDW: 14.6 % (ref 11.5–15.5)
RDW: 14.5 % (ref 11.5–15.5)
WBC: 8.7 10*3/uL (ref 4.0–10.5)
WBC: 8.1 10*3/uL (ref 4.0–10.5)

## 2010-12-18 LAB — PROTIME-INR
INR: 1.21 (ref 0.00–1.49)
INR: 1.04 (ref 0.00–1.49)
Prothrombin Time: 15.5 seconds — ABNORMAL HIGH (ref 11.6–15.2)
Prothrombin Time: 13.8 seconds (ref 11.6–15.2)

## 2010-12-18 LAB — HEPARIN LEVEL (UNFRACTIONATED)
Heparin Unfractionated: 0.53 IU/mL (ref 0.30–0.70)
Heparin Unfractionated: 0.74 IU/mL — ABNORMAL HIGH (ref 0.30–0.70)

## 2010-12-18 LAB — MAGNESIUM
Magnesium: 2.7 mg/dL — ABNORMAL HIGH (ref 1.5–2.5)
Magnesium: 2.7 mg/dL — ABNORMAL HIGH (ref 1.5–2.5)

## 2010-12-21 ENCOUNTER — Encounter: Payer: Self-pay | Admitting: Cardiology

## 2010-12-23 ENCOUNTER — Telehealth: Payer: Self-pay | Admitting: Cardiology

## 2010-12-23 ENCOUNTER — Encounter: Payer: Self-pay | Admitting: Cardiology

## 2010-12-23 LAB — COMPREHENSIVE METABOLIC PANEL
ALT: 22 U/L (ref 0–53)
AST: 27 U/L (ref 0–37)
Albumin: 3.3 g/dL — ABNORMAL LOW (ref 3.5–5.2)
Albumin: 3.1 g/dL — ABNORMAL LOW (ref 3.5–5.2)
Albumin: 3.2 g/dL — ABNORMAL LOW (ref 3.5–5.2)
Alkaline Phosphatase: 93 U/L (ref 39–117)
Alkaline Phosphatase: 73 U/L (ref 39–117)
BUN: 14 mg/dL (ref 6–23)
BUN: 10 mg/dL (ref 6–23)
CO2: 26 mEq/L (ref 19–32)
CO2: 25 mEq/L (ref 19–32)
Chloride: 104 mEq/L (ref 96–112)
Chloride: 105 mEq/L (ref 96–112)
Chloride: 104 mEq/L (ref 96–112)
Creatinine, Ser: 0.85 mg/dL (ref 0.4–1.5)
GFR calc Af Amer: >60 mL/min (ref >60)
GFR calc non Af Amer: >60 mL/min (ref >60)
Glucose, Bld: 101 mg/dL — ABNORMAL HIGH (ref 70–99)
Potassium: 3.8 mEq/L (ref 3.5–5.1)
Potassium: 4.6 mEq/L (ref 3.5–5.1)
Sodium: 138 mEq/L (ref 135–145)
Sodium: 135 mEq/L (ref 135–145)
Total Bilirubin: 0.9 mg/dL (ref 0.3–1.2)
Total Bilirubin: 1 mg/dL (ref 0.3–1.2)
Total Protein: 6.3 g/dL (ref 6.0–8.3)

## 2010-12-23 LAB — GLUCOSE, CAPILLARY
Glucose-Capillary: 171 mg/dL — ABNORMAL HIGH (ref 70–99)
Glucose-Capillary: 118 mg/dL — ABNORMAL HIGH (ref 70–99)
Glucose-Capillary: 174 mg/dL — ABNORMAL HIGH (ref 70–99)
Glucose-Capillary: 198 mg/dL — ABNORMAL HIGH (ref 70–99)
Glucose-Capillary: 91 mg/dL (ref 70–99)
Glucose-Capillary: 174 mg/dL — ABNORMAL HIGH (ref 70–99)

## 2010-12-23 LAB — CBC
HCT: 35.2 % — ABNORMAL LOW (ref 39.0–52.0)
HCT: 34.7 % — ABNORMAL LOW (ref 39.0–52.0)
Hemoglobin: 11.3 g/dL — ABNORMAL LOW (ref 13.0–17.0)
MCV: 95.7 fL (ref 78.0–100.0)
MCV: 96.7 fL (ref 78.0–100.0)
Platelets: 361 10*3/uL (ref 150–400)
Platelets: 368 10*3/uL (ref 150–400)
Platelets: 350 10*3/uL (ref 150–400)
RBC: 3.63 MIL/uL — ABNORMAL LOW (ref 4.22–5.81)
RBC: 3.59 MIL/uL — ABNORMAL LOW (ref 4.22–5.81)
RDW: 14.4 % (ref 11.5–15.5)
RDW: 14.6 % (ref 11.5–15.5)
WBC: 8 10*3/uL (ref 4.0–10.5)
WBC: 8.7 10*3/uL (ref 4.0–10.5)

## 2010-12-23 LAB — CULTURE, BLOOD (ROUTINE X 2)
Culture  Setup Time: 201201132143
Culture  Setup Time: 201201132143
Culture: NO GROWTH
Culture: NO GROWTH

## 2010-12-23 LAB — PROTIME-INR
INR: 1.08 (ref 0.00–1.49)
Prothrombin Time: 13.7 seconds (ref 11.6–15.2)
Prothrombin Time: 14.2 seconds (ref 11.6–15.2)

## 2010-12-23 LAB — CONVERTED CEMR LAB
POC INR: 1.8
Prothrombin Time: 21.7 s

## 2010-12-23 LAB — HEPARIN LEVEL (UNFRACTIONATED): Heparin Unfractionated: 0.43 IU/mL (ref 0.30–0.70)

## 2010-12-24 ENCOUNTER — Ambulatory Visit
Admission: RE | Admit: 2010-12-24 | Discharge: 2010-12-24 | Payer: Self-pay | Source: Home / Self Care | Attending: Cardiology | Admitting: Cardiology

## 2010-12-24 ENCOUNTER — Encounter: Payer: Self-pay | Admitting: Cardiology

## 2010-12-24 LAB — COMPREHENSIVE METABOLIC PANEL
Alkaline Phosphatase: 77 U/L (ref 39–117)
BUN: 10 mg/dL (ref 6–23)
CO2: 26 mEq/L (ref 19–32)
Chloride: 105 mEq/L (ref 96–112)
Creatinine, Ser: 0.8 mg/dL (ref 0.4–1.5)
GFR calc non Af Amer: >60 mL/min (ref >60)
Glucose, Bld: 89 mg/dL (ref 70–99)
Potassium: 4 mEq/L (ref 3.5–5.1)
Total Bilirubin: 1.1 mg/dL (ref 0.3–1.2)

## 2010-12-24 LAB — CBC
HCT: 33.5 % — ABNORMAL LOW (ref 39.0–52.0)
Hemoglobin: 10.9 g/dL — ABNORMAL LOW (ref 13.0–17.0)
MCH: 31.8 pg (ref 26.0–34.0)
MCV: 97.7 fL (ref 78.0–100.0)
Platelets: 344 10*3/uL (ref 150–400)
RBC: 3.43 MIL/uL — ABNORMAL LOW (ref 4.22–5.81)
WBC: 7.5 10*3/uL (ref 4.0–10.5)

## 2010-12-24 LAB — PROTIME-INR
INR: 1.7 — ABNORMAL HIGH (ref 0.00–1.49)
Prothrombin Time: 20.2 seconds — ABNORMAL HIGH (ref 11.6–15.2)

## 2010-12-24 LAB — HEPARIN LEVEL (UNFRACTIONATED): Heparin Unfractionated: 0.63 IU/mL (ref 0.30–0.70)

## 2010-12-25 ENCOUNTER — Encounter: Payer: Self-pay | Admitting: Cardiology

## 2010-12-26 ENCOUNTER — Encounter: Payer: Self-pay | Admitting: Cardiology

## 2010-12-26 NOTE — Discharge Summary (Addendum)
  [name], [name]                ACCOUNT NO.:  [id number]  MEDICAL RECORD NO.:  [id number]           PATIENT TYPE:  LOCATION:                                 FACILITY:  PHYSICIAN:  Hind I Elsaid, MD      DATE OF BIRTH:  01/02/1932  DATE OF ADMISSION:  12/17/2010 DATE OF DISCHARGE:  12/21/2010                              DISCHARGE SUMMARY   DISCHARGE DIAGNOSIS:  Remained the same as being dictated by Dr. Ladell Pier.  DISCHARGE MEDICATIONS: 1. Lovenox 70 mg subcutaneously twice daily, prescription given for 7     days. 2. Warfarin 7.5 mg p.o. daily. 3. Altace 10 mg p.o. daily. 4. Aspirin 81 mg p.o. daily. 5. Cipro 500 mg p.o. twice daily for 7 days. 6. Lasix 20 mg p.o. daily. 7. Metoprolol 12.5 mg p.o. twice daily. 8. Multivitamin. 9. Prednisone 5 mg p.o. daily. 10.Synthroid 125 mcg p.o. daily. 11.Terazosin 2 mg at bedtime. 12.Tramadol 1 tablet daily as needed.  FOLLOWUP:  The patient will follow up with Dr. Andee Lineman for adjustment of his Coumadin level on Tuesday, December 24, 2010, at 2 p.m., family informed.  Also, the patient will need to follow up with his primary care physician.  This case is discussed with Dr. Lewayne Bunting and agreed with discharging the patient with Lovenox and Coumadin, and Coumadin will be checked by him.  The patient will be discharged with home health RN.  Also, the patient will be discharged with Cipro to cover for the history of E. coli bacteremia.  The patient will complete 7 days of antibiotic.     Hind Bosie Helper, MD     HIE/MEDQ  D:  12/21/2010  T:  12/22/2010  Job:  [id number]  cc:   Learta Codding, MD,FACC Kingsley Callander. Ouida Sills, MD  Electronically Signed by Ebony Cargo MD on 12/26/2010 01:11:06 PM

## 2010-12-27 ENCOUNTER — Telehealth: Payer: Self-pay | Admitting: Cardiology

## 2010-12-27 ENCOUNTER — Encounter: Payer: Self-pay | Admitting: Cardiology

## 2010-12-31 ENCOUNTER — Encounter: Payer: Self-pay | Admitting: Cardiology

## 2010-12-31 ENCOUNTER — Telehealth: Payer: Self-pay | Admitting: Cardiology

## 2010-12-31 NOTE — Miscellaneous (Signed)
  Clinical Lists Changes  Observations: Added new observation of ECHOINTERP: Conclusions: 1. The study quality is fair.   2. The left ventricular chamber size is mildly dilated. Mild concentric left ventricular hypertrophy is observed. There is mildly decreased left ventricular systolic function. The estimated ejection fraction is 40-45%. Post surgical hypokinesis of the interventricular septum is observed consistent with valve replacement.   3. The left atrium is moderate to severely dilated.   4. A mechanical prosthetic mitral valve is present (Starr-Edwards). There is a trace of mitral regurgitation. The mean gradient across the mitral valve is 5 mmHg.   5. A mechanical prosthetic aortic valve is present (St Jude). There is a trace of aortic regurgitation. The mean gradient of the aortic valve is 9 mmHg.   6. The right ventricular global systolic function is normal.  7. The right atrium is mildly dilated.   8. There is mild tricuspid regurgitation.  9. The right ventricular systolic pressure is calculated at 37 mmHg.   10. There is no pericardial effusion.    (02/18/2010 16:25)      Echocardiogram  Procedure date:  02/18/2010  Findings:      Conclusions: 1. The study quality is fair.   2. The left ventricular chamber size is mildly dilated. Mild concentric left ventricular hypertrophy is observed. There is mildly decreased left ventricular systolic function. The estimated ejection fraction is 40-45%. Post surgical hypokinesis of the interventricular septum is observed consistent with valve replacement.   3. The left atrium is moderate to severely dilated.   4. A mechanical prosthetic mitral valve is present (Starr-Edwards). There is a trace of mitral regurgitation. The mean gradient across the mitral valve is 5 mmHg.   5. A mechanical prosthetic aortic valve is present (St Jude). There is a trace of aortic regurgitation. The mean gradient of the aortic valve is 9 mmHg.   6. The right  ventricular global systolic function is normal.  7. The right atrium is mildly dilated.   8. There is mild tricuspid regurgitation.  9. The right ventricular systolic pressure is calculated at 37 mmHg.   10. There is no pericardial effusion.

## 2010-12-31 NOTE — Assessment & Plan Note (Signed)
Summary: rov --agh   Visit Type:  Follow-up Primary Provider:  Dr. Ouida Sills   History of Present Illness: the patient is a 77 year old male with a history of severe disabling rheumatoid arthritis. He is status post mechanical aortic and mitral valve replacement. 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 His INR is maintained between 3.5 and 4.5.he also has permanent atrial fibrillation.  Recently he has noted painless hematuria which lasted several days. His INR was 3.1 at that time. The patient had a CT scan of the abdomen and pelvis done yesterday and was found to have renal calculi, no hydronephrosis prostatic enlargement and bilateral inguinal hernias. Apparently he has been recommended to undergo cystoscopy.  The patient reports on occasion substernal chest pain which is very short-lived as well as exertional dyspnea. However the patient's poorly conditioned because of his severe rheumatoid arthritis. He reports an occasional hacking cough. He reports nausea and sometimes feeling sick on his stomach. He has not taken nitroglycerin for his chest pain.  The patient also has been treated for depression which is much improved on citalopram although is only taking a low dose.  Preventive Screening-Counseling & Management  Alcohol-Tobacco     Smoking Status: quit  Comments: quit smoking about 30 yrs ago  Current Medications (verified): 1)  Aspirin 81 Mg Tbec (Aspirin) .... Take One Tablet By Mouth Daily 2)  Ramipril 5 Mg Caps (Ramipril) .... Take 1 Tablet By Mouth Once A Day 3)  Prednisone 5 Mg Tabs (Prednisone) .... Take 1 Tablet By Mouth Once A Day 4)  Levothroid 125 Mcg Tabs (Levothyroxine Sodium) .... Take 1 Tablet By Mouth Once A Day 5)  Coumadin 5 Mg Solr (Warfarin Sodium) .... As Directed Per Coumadin Clinic 6)  Clonazepam 0.5 Mg Tabs (Clonazepam) .... Take 1/2 Tablet By Mouth As Needed 7)  Celexa 20 Mg Tabs (Citalopram Hydrobromide) .... Take 1 Tablet By Mouth  Once A Day 8)  Terazosin Hcl 5 Mg Caps (Terazosin Hcl) .... Take 1 Tablet By Mouth Once A Day 9)  Tramadol Hcl 50 Mg Tabs (Tramadol Hcl) .... Take 1 Tablet By Mouth Four Times A Day As Needed 10)  Hydrocodone-Acetaminophen 5-500 Mg Tabs (Hydrocodone-Acetaminophen) .... Take 1/2 Tablet By Mouth Once A Day As Needed 11)  Lasix 20 Mg Tabs (Furosemide) .... Take 1/2 Tab (10mg ) Daily 12)  Vitamin B-1 100 Mg Tabs (Thiamine Hcl) .... Take 1 Tablet By Mouth Once A Day 13)  Ascorbic Acid 500 Mg Tabs (Ascorbic Acid) .... Take 1 Tablet By Mouth Once A Day 14)  Osteosheath 0.4mg  .... Take 1-3 Tablets Once Daily. 15)  Metoprolol Tartrate 25 Mg Tabs (Metoprolol Tartrate) .... Take 1/2 Tab (12.5mg ) Two Times A Day 16)  Nitrostat 0.4 Mg Subl (Nitroglycerin) .... Dissolve One Tablet Under Tongue For Severe Chest Pain As Needed Every 5 Minutes, Not To Exceed 3 in 15 Min Time Frame  Allergies: 1)  ! Penicillin 2)  Codeine  Comments:  Nurse/Medical Assistant: The patient's medications were reviewed with the patient and were updated in the Medication List. Pt brought a list of medications to office visit.  Cyril Loosen, RN, BSN (July 03, 2010 1:28 PM)  Past History:  Past Medical History: Last updated: 10/16/2008 mild LV dysfunction ejection fraction 45 to 50% rheumatic heart disease, status post St. Jude aortic valve and Star Edwardsmitral valve replacement permanent atrial fibrillation cerebral emboli CVA secondary to subtherapeutic INR small abdominal aneurysm history of hypertension rheumatoid arthritis  Family  History: Last updated: 11/09/2009 noncontributory  Social History: Last updated: 11/09/2009 the patient lives with his wife in [place] he does not smoke or drink  Review of Systems       The patient complains of chest pain, shortness of breath, blood in urine, and depression.  The patient denies fatigue, malaise, fever, weight gain/loss, vision loss, decreased hearing,  hoarseness, palpitations, prolonged cough, wheezing, sleep apnea, coughing up blood, abdominal pain, blood in stool, nausea, vomiting, diarrhea, heartburn, incontinence, muscle weakness, joint pain, leg swelling, rash, skin lesions, headache, fainting, dizziness, anxiety, enlarged lymph nodes, easy bruising or bleeding, and environmental allergies.    Vital Signs:  Patient profile:   77 year old male Height:      69 inches Weight:      154.75 pounds BMI:     22.94 Pulse rate:   67 / minute BP sitting:   122 / 73  (left arm) Cuff size:   regular  Vitals Entered By: Cyril Loosen, RN, BSN (July 03, 2010 1:22 PM) Comments Pt's wife called concerned that pt may be in HF.   Physical Exam  Additional Exam:  General: severe rheumatoid arthritis in some distress because of pain head: Normocephalic and atraumatic eyes PERRLA/EOMI intact, conjunctiva and lids normal nose: No deformity or lesions mouth normal dentition, normal posterior pharynx neck: Supple, no JVD.  No masses, thyromegaly or abnormal cervical nodes lungs: Normal breath sounds bilaterally without wheezing.  Normal percussion heart: irregular rate and rhythm with normal closing click of S1 and S2 no S3 or S4.  PMI is normal.  No pathological murmurs abdomen: Normal bowel sounds, abdomen is soft and nontender without masses, organomegaly or hernias noted.  No hepatosplenomegaly musculoskeletal: Back normal, normal gait muscle strength and tone normal pulsus: Pulse is normal in all 4 extremities Extremities:1+ peripheral pitting edema neurologic: Alert and oriented x 3 skin: Intact without lesions or rashes cervical nodes: No significant adenopathy psychologic: Normal affect\par  Impression & Recommendations:  Problem # 1:  CONGESTIVE HEART FAILURE UNSPECIFIED (ICD-428.0) no evidence of heart failure symptoms. The patient is an ejection fraction of 40%. Continue current medical therapy. His updated medication list for  this problem includes:    Aspirin 81 Mg Tbec (Aspirin) .Marland Kitchen... Take one tablet by mouth daily    Ramipril 5 Mg Caps (Ramipril) .Marland Kitchen... Take 1 tablet by mouth once a day    Coumadin 5 Mg Solr (Warfarin sodium) .Marland Kitchen... As directed per coumadin clinic    Lasix 20 Mg Tabs (Furosemide) .Marland Kitchen... Take 1/2 tab (10mg ) daily    Metoprolol Tartrate 25 Mg Tabs (Metoprolol tartrate) .Marland Kitchen... Take 1/2 tab (12.5mg ) two times a day    Nitrostat 0.4 Mg Subl (Nitroglycerin) .Marland Kitchen... Dissolve one tablet under tongue for severe chest pain as needed every 5 minutes, not to exceed 3 in 15 min time frame  Problem # 2:  MITRAL VALVE REPLACEMENT, HX OF (ICD-V15.1) Assessment: Comment Only normal mitral valve function  Problem # 3:  AORTIC VALVE REPLACEMENT, HX OF (ICD-V43.3) normal aortic valve function  Problem # 4:  ATRIAL FIBRILLATION, CHRONIC, HX OF (ICD-V12.59) on Coumadin. No recurrent embolic events His updated medication list for this problem includes:    Aspirin 81 Mg Tbec (Aspirin) .Marland Kitchen... Take one tablet by mouth daily    Ramipril 5 Mg Caps (Ramipril) .Marland Kitchen... Take 1 tablet by mouth once a day    Coumadin 5 Mg Solr (Warfarin sodium) .Marland Kitchen... As directed per coumadin clinic    Metoprolol Tartrate 25  Mg Tabs (Metoprolol tartrate) .Marland Kitchen... Take 1/2 tab (12.5mg ) two times a day    Nitrostat 0.4 Mg Subl (Nitroglycerin) .Marland Kitchen... Dissolve one tablet under tongue for severe chest pain as needed every 5 minutes, not to exceed 3 in 15 min time frame  Orders: EKG w/ Interpretation (93000)  Problem # 5:  ATYPICAL DEPRESSIVE DISORDER (ICD-296.82) one increase citalopram to 20 milligrams p.o. q. daily  Problem # 6:  CHEST PAIN (ICD-786.50) the patient's chest pain may well be angina pectoris. However his symptoms are not unstable they do not appear to occur at rest. I have given the patient prescription of sublingual nitroglycerin. We will opt to treat the patient medically at all possible. I do not think stress testing is necessary and if  the patient has intractable symptoms cardiac catheterization may need to be considered but I would like to avoid this at all costs. His updated medication list for this problem includes:    Aspirin 81 Mg Tbec (Aspirin) .Marland Kitchen... Take one tablet by mouth daily    Ramipril 5 Mg Caps (Ramipril) .Marland Kitchen... Take 1 tablet by mouth once a day    Coumadin 5 Mg Solr (Warfarin sodium) .Marland Kitchen... As directed per coumadin clinic    Metoprolol Tartrate 25 Mg Tabs (Metoprolol tartrate) .Marland Kitchen... Take 1/2 tab (12.5mg ) two times a day    Nitrostat 0.4 Mg Subl (Nitroglycerin) .Marland Kitchen... Dissolve one tablet under tongue for severe chest pain as needed every 5 minutes, not to exceed 3 in 15 min time frame  Problem # 7:  GROSS HEMATURIA (ICD-599.71) followup with urology and continue anticoagulation  Patient Instructions: 1)  Increase Citalopram to 20mg  daily 2)  Nitroglycerin as needed for severe chest pain 3)  Follow up in  6 months Prescriptions: NITROSTAT 0.4 MG SUBL (NITROGLYCERIN) dissolve one tablet under tongue for severe chest pain as needed every 5 minutes, not to exceed 3 in 15 min time frame  #25 x 3   Entered by:   Hoover Brunette, LPN   Authorized by:   Lewayne Bunting, MD, [facility]   Signed by:   Hoover Brunette, LPN on 16/09/9603   Method used:   Electronically to        [facility]* (retail)       [address]       [place], [place]  [address]       Ph: [phone]       Fax: [phone]   RxID:   [id number] CELEXA 20 MG TABS (CITALOPRAM HYDROBROMIDE) Take 1 tablet by mouth once a day  #30 x 6   Entered by:   Hoover Brunette, LPN   Authorized by:   Lewayne Bunting, MD, [facility]   Signed by:   Hoover Brunette, LPN on 09/27/2535   Method used:   Electronically to        [facility]* (retail)       [address]       [place], [place]  [address]       Ph: [phone]       Fax: [phone]   RxID:   [id number]

## 2010-12-31 NOTE — Medication Information (Signed)
Summary: ccr-lr  Anticoagulant Therapy  Managed by: Vashti Hey, RN PCP: Dr. Scharlene Corn MD: Antoine Poche MD, Fayrene Fearing Indication 1: Aortic Valve Replacement (ICD-V43.3) Indication 2: Atrial Fibrillation (ICD-427.31) [facility] [place] Site: [place] INR POC 4.1  Dietary changes: no    Health status changes: no    Bleeding/hemorrhagic complications: no    Recent/future hospitalizations: no    Any changes in medication regimen? no    Recent/future dental: no  Any missed doses?: no       Is patient compliant with meds? yes       Allergies: 1)  ! Penicillin 2)  Codeine  Anticoagulation Management History:      The patient is taking warfarin and comes in today for a routine follow up visit.  Positive risk factors for bleeding include an age of 75 years or older and history of CVA/TIA.  The bleeding index is 'intermediate risk'.  Positive CHADS2 values include History of CHF, Age > 4 years old, and Prior Stroke/CVA/TIA.  The start date was 01/22/1994.  Anticoagulation responsible provider: Antoine Poche MD, Fayrene Fearing.  INR POC: 4.1.  Cuvette Lot#: [id number].  Exp: 10/11.    Anticoagulation Management Assessment/Plan:      The patient's current anticoagulation dose is Coumadin 5 mg solr: as directed per Coumadin clinic.  The target INR is 3.5 - 4.5.  The next INR is due 06/21/2010.  Anticoagulation instructions were given to patient.  Results were reviewed/authorized by Vashti Hey, RN.  He was notified by Vashti Hey RN.         Prior Anticoagulation Instructions: INR 3.2 Increase coumadin to 5mg  once daily except 7.5mg  on Fridays  Current Anticoagulation Instructions: INR 4.1 Continue coumadin 5mg  once daily except 7.5mg  on Fridays

## 2010-12-31 NOTE — Medication Information (Signed)
Summary: ccr-lr  Anticoagulant Therapy  Managed by: Vashti Hey, RN PCP: Dr. Scharlene Corn MD: Diona Browner MD, Remi Deter Indication 1: Aortic Valve Replacement (ICD-V43.3) Indication 2: Atrial Fibrillation (ICD-427.31) Lab [facility] [place] Site: [place] INR POC 4.6  Dietary changes: no    Health status changes: no    Bleeding/hemorrhagic complications: no    Recent/future hospitalizations: no    Any changes in medication regimen? no    Recent/future dental: no  Any missed doses?: no       Is patient compliant with meds? yes       Allergies: 1)  ! Penicillin 2)  Codeine  Anticoagulation Management History:      The patient is taking warfarin and comes in today for a routine follow up visit.  Positive risk factors for bleeding include an age of 75 years or older and history of CVA/TIA.  The bleeding index is 'intermediate risk'.  Positive CHADS2 values include History of CHF, Age > 4 years old, and Prior Stroke/CVA/TIA.  The start date was 01/22/1994.  Anticoagulation responsible provider: Diona Browner MD, Remi Deter.  INR POC: 4.6.  Cuvette Lot#: [id number].  Exp: 10/11.    Anticoagulation Management Assessment/Plan:      The patient's current anticoagulation dose is Coumadin 5 mg solr: as directed per Coumadin clinic.  The target INR is 3.5 - 4.5.  The next INR is due 09/17/2010.  Anticoagulation instructions were given to patient.  Results were reviewed/authorized by Vashti Hey, RN.  He was notified by Vashti Hey RN.         Prior Anticoagulation Instructions: INR 4.7 Take 1/2 tablet tonight then decrease dose to 1 tablet once daily except 1 1/2 on Tuesdays  Current Anticoagulation Instructions: INR 4.6 Take coumadin 1/2 tablet tonight then decrease dose to 1 tablet once daily

## 2010-12-31 NOTE — Medication Information (Signed)
Summary: ccr-lr  Anticoagulant Therapy  Managed by: Vashti Hey, RN PCP: Dr. Scharlene Corn MD: Andee Lineman MD, Michelle Piper Indication 1: Aortic Valve Replacement (ICD-V43.3) Indication 2: Atrial Fibrillation (ICD-427.31) Lab [facility] [place] Site: [place] INR POC 4.7  Dietary changes: no    Health status changes: no    Bleeding/hemorrhagic complications: no    Recent/future hospitalizations: no    Any changes in medication regimen? no    Recent/future dental: no  Any missed doses?: no       Is patient compliant with meds? yes       Allergies: 1)  ! Penicillin 2)  Codeine  Anticoagulation Management History:      The patient is taking warfarin and comes in today for a routine follow up visit.  Positive risk factors for bleeding include an age of 75 years or older and history of CVA/TIA.  The bleeding index is 'intermediate risk'.  Positive CHADS2 values include History of CHF, Age > 59 years old, and Prior Stroke/CVA/TIA.  The start date was 01/22/1994.  Anticoagulation responsible provider: Andee Lineman MD, Michelle Piper.  INR POC: 4.7.  Cuvette Lot#: [id number].  Exp: 10/11.    Anticoagulation Management Assessment/Plan:      The patient's current anticoagulation dose is Coumadin 5 mg solr: as directed per Coumadin clinic.  The target INR is 3.5 - 4.5.  The next INR is due 08/30/2010.  Anticoagulation instructions were given to patient.  Results were reviewed/authorized by Vashti Hey, RN.         Prior Anticoagulation Instructions: INR 3.7 Continue coumadin 5mg  once daily except 7.5mg  on Tuesdays and Fridays  Current Anticoagulation Instructions: INR 4.7 Take 1/2 tablet tonight then decrease dose to 1 tablet once daily except 1 1/2 on Tuesdays

## 2010-12-31 NOTE — Medication Information (Signed)
Summary: ccr-lr  Anticoagulant Therapy  Managed by: Vashti Hey, RN PCP: Dr. Scharlene Corn MD: Andee Lineman MD, Michelle Piper Indication 1: Aortic Valve Replacement (ICD-V43.3) Indication 2: Atrial Fibrillation (ICD-427.31) Lab [facility] [place] Site: [place] INR POC 4.1  Dietary changes: no    Health status changes: no    Bleeding/hemorrhagic complications: no    Recent/future hospitalizations: no    Any changes in medication regimen? no    Recent/future dental: no  Any missed doses?: no       Is patient compliant with meds? yes       Allergies: 1)  ! Penicillin 2)  Codeine  Anticoagulation Management History:      The patient is taking warfarin and comes in today for a routine follow up visit.  Positive risk factors for bleeding include an age of 79 years or older and history of CVA/TIA.  The bleeding index is 'intermediate risk'.  Positive CHADS2 values include History of CHF, Age > 82 years old, and Prior Stroke/CVA/TIA.  The start date was 01/22/1994.  Anticoagulation responsible provider: Andee Lineman MD, Michelle Piper.  INR POC: 4.1.  Cuvette Lot#: [id number].  Exp: 10/11.    Anticoagulation Management Assessment/Plan:      The patient's current anticoagulation dose is Coumadin 5 mg solr: as directed per Coumadin clinic.  The target INR is 3.5 - 4.5.  The next INR is due 07/12/2010.  Anticoagulation instructions were given to patient.  Results were reviewed/authorized by Vashti Hey, RN.  He was notified by Vashti Hey RN.         Prior Anticoagulation Instructions: INR 4.1 Continue coumadin 5mg  once daily except 7.5mg  on Fridays  Current Anticoagulation Instructions: Same as Prior Instructions.

## 2010-12-31 NOTE — Medication Information (Signed)
Summary: ccr-lr  Anticoagulant Therapy  Managed by: Vashti Hey, RN PCP: Dr. Scharlene Corn MD: Diona Browner MD, Remi Deter Indication 1: Aortic Valve Replacement (ICD-V43.3) Indication 2: Atrial Fibrillation (ICD-427.31) Lab [facility] [place] Site: [place] INR POC 3.9  Dietary changes: no    Health status changes: no    Bleeding/hemorrhagic complications: no    Recent/future hospitalizations: no    Any changes in medication regimen? no    Recent/future dental: no  Any missed doses?: no       Is patient compliant with meds? yes       Allergies: 1)  ! Penicillin 2)  Codeine  Anticoagulation Management History:      The patient is taking warfarin and comes in today for a routine follow up visit.  Positive risk factors for bleeding include an age of 75 years or older and history of CVA/TIA.  The bleeding index is 'intermediate risk'.  Positive CHADS2 values include History of CHF, Age > 60 years old, and Prior Stroke/CVA/TIA.  The start date was 01/22/1994.  Anticoagulation responsible provider: Diona Browner MD, Remi Deter.  INR POC: 3.9.  Cuvette Lot#: [id number].  Exp: 10/11.    Anticoagulation Management Assessment/Plan:      The patient's current anticoagulation dose is Coumadin 5 mg solr: as directed per Coumadin clinic.  The target INR is 3.5 - 4.5.  The next INR is due 03/22/2010.  Anticoagulation instructions were given to patient.  Results were reviewed/authorized by Vashti Hey, RN.  He was notified by Vashti Hey RN.         Prior Anticoagulation Instructions: INR 4.1 Continue coumadin 5mg  once daily   Current Anticoagulation Instructions: INR 3.9 Continue coumadin 5mg  once daily

## 2010-12-31 NOTE — Medication Information (Signed)
Summary: ccr-lr  Anticoagulant Therapy  Managed by: Vashti Hey, RN PCP: Dr. Scharlene Corn MD: Diona Browner MD, Remi Deter Indication 1: Aortic Valve Replacement (ICD-V43.3) Indication 2: Atrial Fibrillation (ICD-427.31) Lab [facility] [place] Site: [place] INR POC 4.6  Dietary changes: no    Health status changes: no    Bleeding/hemorrhagic complications: no    Recent/future hospitalizations: no    Any changes in medication regimen? no    Recent/future dental: no  Any missed doses?: no       Is patient compliant with meds? yes       Allergies: 1)  ! Penicillin 2)  Codeine  Anticoagulation Management History:      The patient is taking warfarin and comes in today for a routine follow up visit.  Positive risk factors for bleeding include an age of 75 years or older and history of CVA/TIA.  The bleeding index is 'intermediate risk'.  Positive CHADS2 values include History of CHF, Age > 62 years old, and Prior Stroke/CVA/TIA.  The start date was 01/22/1994.  Anticoagulation responsible provider: Diona Browner MD, Remi Deter.  INR POC: 4.6.  Cuvette Lot#: [id number].  Exp: 10/11.    Anticoagulation Management Assessment/Plan:      The patient's current anticoagulation dose is Coumadin 5 mg solr: as directed per Coumadin clinic.  The target INR is 3.5 - 4.5.  The next INR is due 10/22/2010.  Anticoagulation instructions were given to patient.  Results were reviewed/authorized by Vashti Hey, RN.  He was notified by Vashti Hey RN.         Prior Anticoagulation Instructions: INR 5.0 Hold coumadin tonight then decrease dose to 5mg  once daily except 2.5mg  on Fridays  Current Anticoagulation Instructions: INR 4.6 Take coumadin 1/2 tablet tonight then resume 1 tablet once daily except 1/2 tablet on Fridays

## 2010-12-31 NOTE — Medication Information (Signed)
Summary: ccr-lr  Anticoagulant Therapy  Managed by: Vashti Hey, RN PCP: Dr. Scharlene Corn MD: Diona Browner MD, Remi Deter Indication 1: Aortic Valve Replacement (ICD-V43.3) Indication 2: Atrial Fibrillation (ICD-427.31) [facility] [place] Site: [place] INR POC 4.1  Dietary changes: no    Health status changes: no    Bleeding/hemorrhagic complications: no    Recent/future hospitalizations: no    Any changes in medication regimen? no    Recent/future dental: no  Any missed doses?: no       Is patient compliant with meds? yes       Allergies: 1)  ! Penicillin 2)  Codeine  Anticoagulation Management History:      The patient is taking warfarin and comes in today for a routine follow up visit.  Positive risk factors for bleeding include an age of 75 years or older and history of CVA/TIA.  The bleeding index is 'intermediate risk'.  Positive CHADS2 values include History of CHF, Age > 58 years old, and Prior Stroke/CVA/TIA.  The start date was 01/22/1994.  Anticoagulation responsible provider: Diona Browner MD, Remi Deter.  INR POC: 4.1.  Cuvette Lot#: [id number].  Exp: 10/11.    Anticoagulation Management Assessment/Plan:      The patient's current anticoagulation dose is Coumadin 5 mg solr: as directed per Coumadin clinic.  The target INR is 3.5 - 4.5.  The next INR is due 03/01/2010.  Anticoagulation instructions were given to patient.  Results were reviewed/authorized by Vashti Hey, RN.  He was notified by Vashti Hey RN.         Prior Anticoagulation Instructions: INR 3.4 Take couamdin 7.5mg  tonight then resume 5mg  once daily   Current Anticoagulation Instructions: INR 4.1 Continue coumadin 5mg  once daily

## 2010-12-31 NOTE — Medication Information (Signed)
Summary: ccr-lr  Anticoagulant Therapy  Managed by: Vashti Hey, RN PCP: Dr. Scharlene Corn MD: Andee Lineman MD, Michelle Piper Indication 1: Aortic Valve Replacement (ICD-V43.3) Indication 2: Atrial Fibrillation (ICD-427.31) Lab [facility] [place] Site: [place] INR POC 4.6  Dietary changes: no    Health status changes: no    Bleeding/hemorrhagic complications: no    Recent/future hospitalizations: no    Any changes in medication regimen? no    Recent/future dental: no  Any missed doses?: no       Is patient compliant with meds? yes       Allergies: 1)  ! Penicillin 2)  Codeine  Anticoagulation Management History:      The patient is taking warfarin and comes in today for a routine follow up visit.  Positive risk factors for bleeding include an age of 75 years or older and history of CVA/TIA.  The bleeding index is 'intermediate risk'.  Positive CHADS2 values include History of CHF, Age > 75 years old, and Prior Stroke/CVA/TIA.  The start date was 01/22/1994.  Anticoagulation responsible provider: Andee Lineman MD, Michelle Piper.  INR POC: 4.6.  Exp: 10/11.    Anticoagulation Management Assessment/Plan:      The patient's current anticoagulation dose is Coumadin 5 mg solr: as directed per Coumadin clinic.  The target INR is 3.5 - 4.5.  The next INR is due 01/04/2010.  Anticoagulation instructions were given to patient.  Results were reviewed/authorized by Vashti Hey, RN.  He was notified by Vashti Hey RN.         Prior Anticoagulation Instructions: INR 4.1 Continue coumadin 5mg  once daily   Current Anticoagulation Instructions: INR 4.6 Take coumadin 2.5mg  tonight then resume 5mg  once daily

## 2010-12-31 NOTE — Medication Information (Signed)
Summary: ccr-lr  Anticoagulant Therapy  Managed by: Vashti Hey, RN PCP: Dr. Scharlene Corn MD: Diona Browner MD, Remi Deter Indication 1: Aortic Valve Replacement (ICD-V43.3) Indication 2: Atrial Fibrillation (ICD-427.31) Lab [facility] [place] Site: [place] INR POC 4.1  Dietary changes: no    Health status changes: no    Bleeding/hemorrhagic complications: no    Recent/future hospitalizations: no    Any changes in medication regimen? no    Recent/future dental: no  Any missed doses?: no       Is patient compliant with meds? yes       Allergies: 1)  ! Penicillin 2)  Codeine  Anticoagulation Management History:      The patient is taking warfarin and comes in today for a routine follow up visit.  Positive risk factors for bleeding include an age of 76 years or older and history of CVA/TIA.  The bleeding index is 'intermediate risk'.  Positive CHADS2 values include History of CHF, Age > 37 years old, and Prior Stroke/CVA/TIA.  The start date was 01/22/1994.  Anticoagulation responsible provider: Diona Browner MD, Remi Deter.  INR POC: 4.1.  Cuvette Lot#: [id number].  Exp: 10/11.    Anticoagulation Management Assessment/Plan:      The patient's current anticoagulation dose is Coumadin 5 mg solr: as directed per Coumadin clinic.  The target INR is 3.5 - 4.5.  The next INR is due 04/30/2010.  Anticoagulation instructions were given to patient.  Results were reviewed/authorized by Vashti Hey, RN.  He was notified by Vashti Hey RN.         Prior Anticoagulation Instructions: INR 3.7 Take coumadin 7.5mg  tonight then resume 5mg  once daily   Current Anticoagulation Instructions: INR 4.1 Continue coumadin 5mg  once daily

## 2010-12-31 NOTE — Medication Information (Signed)
Summary: ccr-lr  Anticoagulant Therapy  Managed by: Vashti Hey, RN PCP: Dr. Scharlene Corn MD: Antoine Poche MD, Fayrene Fearing Indication 1: Aortic Valve Replacement (ICD-V43.3) Indication 2: Atrial Fibrillation (ICD-427.31) Lab [facility] [place] Site: [place] INR POC 3.1  Dietary changes: no    Health status changes: no    Bleeding/hemorrhagic complications: no    Recent/future hospitalizations: no    Any changes in medication regimen? no    Recent/future dental: no  Any missed doses?: no       Is patient compliant with meds? yes       Allergies: 1)  ! Penicillin 2)  Codeine  Anticoagulation Management History:      The patient is taking warfarin and comes in today for a routine follow up visit.  Positive risk factors for bleeding include an age of 75 years or older and history of CVA/TIA.  The bleeding index is 'intermediate risk'.  Positive CHADS2 values include History of CHF, Age > 45 years old, and Prior Stroke/CVA/TIA.  The start date was 01/22/1994.  Anticoagulation responsible provider: Antoine Poche MD, Fayrene Fearing.  INR POC: 3.1.  Exp: 10/11.    Anticoagulation Management Assessment/Plan:      The patient's current anticoagulation dose is Coumadin 5 mg solr: as directed per Coumadin clinic.  The target INR is 3.5 - 4.5.  The next INR is due 05/10/2010.  Anticoagulation instructions were given to patient.  Results were reviewed/authorized by Vashti Hey, RN.  He was notified by Vashti Hey RN.         Prior Anticoagulation Instructions: INR 4.1 Continue coumadin 5mg  once daily   Current Anticoagulation Instructions: INR 3.1 Take coumadin 7.5mg  x 2 then resume 5mg  once daily

## 2010-12-31 NOTE — Progress Notes (Signed)
Summary: patients wife concerned he maybe in CHF  Phone Note Call from Patient Call back at Home Phone [phone]   Caller: WIFE  Reason for Call: Talk to Nurse Details for Reason: thinks he is in CHF Summary of Call: [name] called very concerned that he is in CHF.  Please call her as soon as you can.  She left a voice mail for you also Initial call taken by: Claudette Laws,  July 01, 2010 8:29 AM  Follow-up for Phone Call        Spoke with wife this morning.  Very concerned, thinks [name] is having CHF.  Seems like abdomen is full and noticed that he sounded gurgly during his sleep last p.m.  Offered appt. at 8:30 in the morning as you are not in the office today.  (hosp DOD) States he has a CT scan tomorrow morning for Dr. Ouida Sills at 9:45 and she doesn't think he can make it to two appointments.  Also, suggested that she could take him to his PMD today or to ED for evaluation.  She decllined both of these.  States that he doesn't know that she called becuause he gets upset.  States he is just laying around most of the day.  Informed her that I would let Dr. Andee Lineman know her concerns.   Follow-up by: Hoover Brunette, LPN,  July 01, 2010 9:29 AM  Additional Follow-up for Phone Call Additional follow up Details #1::        add him on for Tuesday PM - tomorrow or Wednesday.  Additional Follow-up by: Lewayne Bunting, MD, [facility],  July 01, 2010 6:23 PM    Additional Follow-up for Phone Call Additional follow up Details #2::    Wife notified.  Willl add to GD schedule for tomorrow at 1:30 per MD. Hoover Brunette, LPN  July 02, 2010 4:22 PM   8:22

## 2010-12-31 NOTE — Miscellaneous (Signed)
Summary: Orders Update  Clinical Lists Changes  Orders: Added new Referral order of 2-D Echocardiogram (2D Echo) - Signed 

## 2010-12-31 NOTE — Assessment & Plan Note (Signed)
Summary: 6 MO FU PER JUNE REMINDER-SRS   Visit Type:  Follow-up Primary Provider:  Dr. Ouida Sills  CC:  follow-up visit.  History of Present Illness: the patient is a 77 year old male with a history of severe rheumatoid arthritis. Status post mechanical aortic and mitral valve replacement. 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 His INR is maintained between 3.5 and 4.5. The patient is limited in his mobility due to severe rheumatoid arthritis. His depression has much improved on citalopram. Over the last several days he has complained of some increased shortness of breath. He also has noticed some lower extremity edema. The patient only takes Lasix on a p.r.n. basis.  Had a recent echocardiogram done demonstrating moderate LV dysfunction with an ejection fraction of 40-45% but with normal function and valves including a St. Jude aortic and mitral Starr-Edwards. Gradients were normal for particular valves. His left atrium was moderately to severely dilated. Patient does have chronic atrial fibrillation. His rate is well controlled.  Preventive Screening-Counseling & Management  Alcohol-Tobacco     Smoking Status: quit     Year Started: 1945     Year Quit: 1970  Current Medications (verified): 1)  Aspirin 81 Mg Tbec (Aspirin) .... Take One Tablet By Mouth Daily 2)  Ramipril 5 Mg Caps (Ramipril) .... Take 1 Tablet By Mouth Once A Day 3)  Prednisone 5 Mg Tabs (Prednisone) .... Take 1 Tablet By Mouth Once A Day 4)  Levothroid 125 Mcg Tabs (Levothyroxine Sodium) .... Take 1 Tablet By Mouth Once A Day 5)  Coumadin 5 Mg Solr (Warfarin Sodium) .... As Directed Per Coumadin Clinic 6)  Clonazepam 0.5 Mg Tabs (Clonazepam) .... Take 1/2 Tablet By Mouth As Needed 7)  Celexa 20 Mg Tabs (Citalopram Hydrobromide) .... Take 1/2 Tablet (10mg ) By Mouth Daily 8)  Terazosin Hcl 5 Mg Caps (Terazosin Hcl) .... Take 1 Tablet By Mouth Once A Day 9)  Tramadol Hcl 50 Mg Tabs (Tramadol Hcl)  .... Take 1 Tablet By Mouth Four Times A Day As Needed 10)  Hydrocodone-Acetaminophen 5-500 Mg Tabs (Hydrocodone-Acetaminophen) .... Take 1/2 Tablet By Mouth Once A Day As Needed 11)  Lasix 20 Mg Tabs (Furosemide) .... Take 1/2 Tab (10mg ) Daily 12)  Vitamin B-1 100 Mg Tabs (Thiamine Hcl) .... Take 1 Tablet By Mouth Once A Day 13)  Ascorbic Acid 500 Mg Tabs (Ascorbic Acid) .... Take 1 Tablet By Mouth Once A Day 14)  Osteo Bi-Flex Adv Joint Shield  Tabs (Misc Natural Products) .... Take 2 Tablet By Mouth Once A Day 15)  Glucosamine-Chondroitin 1500-1200 Mg/71ml Liqd (Glucosamine-Chondroitin) .... Take 3 Tablet By Mouth Once A Day 16)  Metoprolol Tartrate 25 Mg Tabs (Metoprolol Tartrate) .... Take 1/2 Tab (12.5mg ) Two Times A Day  Allergies: 1)  ! Penicillin 2)  Codeine  Comments:  Nurse/Medical Assistant: The patient's medications were reviewed with the patient and were updated in the Medication List. Pt brought a list of medications to office visit.  Cyril Loosen, RN, BSN (May 08, 2010 3:03 PM)  Past History:  Past Medical History: Last updated: 10/16/2008 mild LV dysfunction ejection fraction 45 to 50% rheumatic heart disease, status post St. Jude aortic valve and Star Edwardsmitral valve replacement permanent atrial fibrillation cerebral emboli CVA secondary to subtherapeutic INR small abdominal aneurysm history of hypertension rheumatoid arthritis  Social History: Last updated: 11/09/2009 the patient lives with his wife in [place] he does not smoke or drink  Risk Factors: Smoking Status:  quit (05/08/2010)  Review of Systems       The patient complains of fatigue, shortness of breath, muscle weakness, joint pain, and leg swelling.  The patient denies malaise, fever, weight gain/loss, vision loss, decreased hearing, hoarseness, chest pain, palpitations, prolonged cough, wheezing, sleep apnea, coughing up blood, abdominal pain, blood in stool, nausea, vomiting, diarrhea,  heartburn, incontinence, blood in urine, rash, skin lesions, headache, fainting, dizziness, depression, anxiety, enlarged lymph nodes, easy bruising or bleeding, and environmental allergies.    Vital Signs:  Patient profile:   77 year old male Height:      69 inches Weight:      155.75 pounds Pulse rate:   80 / minute BP sitting:   127 / 76  (left arm) Cuff size:   regular  Vitals Entered By: Cyril Loosen, RN, BSN (May 08, 2010 2:59 PM) CC: follow-up visit   Physical Exam  Additional Exam:  General: severe rheumatoid arthritis in some distress because of pain head: Normocephalic and atraumatic eyes PERRLA/EOMI intact, conjunctiva and lids normal nose: No deformity or lesions mouth normal dentition, normal posterior pharynx neck: Supple, no JVD.  No masses, thyromegaly or abnormal cervical nodes lungs: Normal breath sounds bilaterally without wheezing.  Normal percussion heart: irregular rate and rhythm with normal closing click of S1 and S2 no S3 or S4.  PMI is normal.  No pathological murmurs abdomen: Normal bowel sounds, abdomen is soft and nontender without masses, organomegaly or hernias noted.  No hepatosplenomegaly musculoskeletal: Back normal, normal gait muscle strength and tone normal pulsus: Pulse is normal in all 4 extremities Extremities:1+ peripheral pitting edema neurologic: Alert and oriented x 3 skin: Intact without lesions or rashes cervical nodes: No significant adenopathy psychologic: Normal affect\par  Impression & Recommendations:  Problem # 1:  CONGESTIVE HEART FAILURE UNSPECIFIED (ICD-428.0) the patient has some evidence of volume overload. He has moderate LV dysfunction asked him to take 10 mg of Lasix every day.his symptoms also may of been aggravated a somewhat of an increased heart rate. I asked him to take metoprolol 12.5 mg p.o. b.i.d. not p.r.n. His updated medication list for this problem includes:    Aspirin 81 Mg Tbec (Aspirin) .Marland Kitchen... Take one  tablet by mouth daily    Ramipril 5 Mg Caps (Ramipril) .Marland Kitchen... Take 1 tablet by mouth once a day    Coumadin 5 Mg Solr (Warfarin sodium) .Marland Kitchen... As directed per coumadin clinic    Lasix 20 Mg Tabs (Furosemide) .Marland Kitchen... Take 1/2 tab (10mg ) daily    Metoprolol Tartrate 25 Mg Tabs (Metoprolol tartrate) .Marland Kitchen... Take 1/2 tab (12.5mg ) two times a day  Problem # 2:  MITRAL VALVE REPLACEMENT, HX OF (ICD-V15.1) normal function  Problem # 3:  AORTIC VALVE REPLACEMENT, HX OF (ICD-V43.3) normal function.  Problem # 4:  ATRIAL FIBRILLATION, CHRONIC, HX OF (ICD-V12.59) better rate control control can be achieved.INR will be kept between 3.5 and 4.5. His updated medication list for this problem includes:    Aspirin 81 Mg Tbec (Aspirin) .Marland Kitchen... Take one tablet by mouth daily    Ramipril 5 Mg Caps (Ramipril) .Marland Kitchen... Take 1 tablet by mouth once a day    Coumadin 5 Mg Solr (Warfarin sodium) .Marland Kitchen... As directed per coumadin clinic    Metoprolol Tartrate 25 Mg Tabs (Metoprolol tartrate) .Marland Kitchen... Take 1/2 tab (12.5mg ) two times a day  Patient Instructions: 1)  Lasix 20mg  - take 1/2 tab (10mg ) daily 2)  Metoprolol 12.5mg  - take two times a day (not as  needed) 3)  Celexa 10mg  - take one tab daily (not as needed) 4)  Follow up in  3 months Prescriptions: LASIX 20 MG TABS (FUROSEMIDE) take 1/2 tab (10mg ) daily  #15 x 6   Entered by:   Hoover Brunette, LPN   Authorized by:   Lewayne Bunting, MD, [facility]   Signed by:   Hoover Brunette, LPN on 16/09/9603   Method used:   Electronically to        [facility]* (retail)       [address]       [place], [place]  [address]       Ph: [phone]       Fax: [phone]   RxID:   [id number] METOPROLOL TARTRATE 25 MG TABS (METOPROLOL TARTRATE) take 1/2 tab (12.5mg ) two times a day  #30 x 6   Entered by:   Hoover Brunette, LPN   Authorized by:   Lewayne Bunting, MD, [facility]   Signed by:   Hoover Brunette, LPN on 09/27/2535   Method used:   Electronically to        [facility]* (retail)       [address]       [place], [place]  [address]       Ph: [phone]       Fax: [phone]   RxID:   [id number] CELEXA 20 MG TABS (CITALOPRAM HYDROBROMIDE) Take 1/2 tablet (10mg ) by mouth daily  #15 x 2   Entered by:   Hoover Brunette, LPN   Authorized by:   Lewayne Bunting, MD, [facility]   Signed by:   Hoover Brunette, LPN on 32/35/5732   Method used:   Electronically to        [facility]* (retail)       [address]       [place], [place]  [address]       Ph: [phone]       Fax: [phone]   RxID:   [id number]

## 2010-12-31 NOTE — Medication Information (Signed)
Summary: ccrv--agh  Anticoagulant Therapy  Managed by: Vashti Hey, RN PCP: Dr. Scharlene Corn MD: Andee Lineman MD, Michelle Piper Indication 1: Aortic Valve Replacement (ICD-V43.3) Indication 2: Atrial Fibrillation (ICD-427.31) Lab [facility] [place] Site: [place] INR POC 3.2  Dietary changes: no    Health status changes: no    Bleeding/hemorrhagic complications: yes       Details: hematuria on Friday and Saturday   Called an spoke with Joni Reining  Pt held 1/2 dose of coumadin Sat night   Saw Dr Ouida Sills for regulare check up yesterday.  U/A and C&S obtained  U/A was normal  C&S pending  Recent/future hospitalizations: no    Any changes in medication regimen? no    Recent/future dental: no  Any missed doses?: yes     Details: pt skipped 1/2 dose on Saturday night  Is patient compliant with meds? yes       Allergies: 1)  ! Penicillin 2)  Codeine  Anticoagulation Management History:      The patient is taking warfarin and comes in today for a routine follow up visit.  Positive risk factors for bleeding include an age of 75 years or older and history of CVA/TIA.  The bleeding index is 'intermediate risk'.  Positive CHADS2 values include History of CHF, Age > 75 years old, and Prior Stroke/CVA/TIA.  The start date was 01/22/1994.  Anticoagulation responsible provider: Andee Lineman MD, Michelle Piper.  INR POC: 3.2.  Exp: 10/11.    Anticoagulation Management Assessment/Plan:      The patient's current anticoagulation dose is Coumadin 5 mg solr: as directed per Coumadin clinic.  The target INR is 3.5 - 4.5.  The next INR is due 07/12/2010.  Anticoagulation instructions were given to patient.  Results were reviewed/authorized by Vashti Hey, RN.  He was notified by Vashti Hey RN.         Prior Anticoagulation Instructions: INR 4.1 Continue coumadin 5mg  once daily except 7.5mg  on Fridays  Current Anticoagulation Instructions: INR 3.2 Take coumadin 7.5mg  tonight then resume 5mg  once daily except  7.5mg  on Friday

## 2010-12-31 NOTE — Medication Information (Signed)
Summary: ccr-lr  Anticoagulant Therapy  Managed by: Vashti Hey, RN PCP: Dr. Scharlene Corn MD: Myrtis Ser MD, Tinnie Gens Indication 1: Aortic Valve Replacement (ICD-V43.3) Indication 2: Atrial Fibrillation (ICD-427.31) Lab [facility] [place] Site: [place] INR POC 3.7           Allergies: 1)  ! Penicillin 2)  Codeine  Anticoagulation Management History:      Positive risk factors for bleeding include an age of 75 years or older and history of CVA/TIA.  The bleeding index is 'intermediate risk'.  Positive CHADS2 values include History of CHF, Age > 75 years old, and Prior Stroke/CVA/TIA.  The start date was 01/22/1994.  Anticoagulation responsible provider: Myrtis Ser MD, Tinnie Gens.  INR POC: 3.7.  Exp: 10/11.    Anticoagulation Management Assessment/Plan:      The patient's current anticoagulation dose is Coumadin 5 mg solr: as directed per Coumadin clinic.  The target INR is 3.5 - 4.5.  The next INR is due 08/16/2010.  Anticoagulation instructions were given to patient.  Results were reviewed/authorized by Vashti Hey, RN.  He was notified by Vashti Hey RN.         Prior Anticoagulation Instructions: INR 3.3 Increase coumadin to 5mg  once daily except 7.5mg  on Tuesdays and Fridays  Current Anticoagulation Instructions: INR 3.7 Continue coumadin 5mg  once daily except 7.5mg  on Tuesdays and Fridays

## 2010-12-31 NOTE — Medication Information (Signed)
Summary: ccr-lr  Anticoagulant Therapy  Managed by: Vashti Hey, RN PCP: Dr. Scharlene Corn MD: Myrtis Ser MD, Tinnie Gens Indication 1: Aortic Valve Replacement (ICD-V43.3) Indication 2: Atrial Fibrillation (ICD-427.31) Lab [facility] [place] Site: [facility] INR POC 4.8  Dietary changes: no    Health status changes: no    Bleeding/hemorrhagic complications: no    Recent/future hospitalizations: no    Any changes in medication regimen? no    Recent/future dental: no  Any missed doses?: no       Is patient compliant with meds? yes       Allergies: 1)  ! Penicillin 2)  Codeine  Anticoagulation Management History:      The patient is taking warfarin and comes in today for a routine follow up visit.  Positive risk factors for bleeding include an age of 70 years or older and history of CVA/TIA.  The bleeding index is 'intermediate risk'.  Positive CHADS2 values include History of CHF, Age > 46 years old, and Prior Stroke/CVA/TIA.  The start date was 01/22/1994.  Anticoagulation responsible provider: Myrtis Ser MD, Tinnie Gens.  INR POC: 4.8.  Cuvette Lot#: [id number].  Exp: 10/11.    Anticoagulation Management Assessment/Plan:      The patient's current anticoagulation dose is Coumadin 5 mg solr: as directed per Coumadin clinic.  The target INR is 3.5 - 4.5.  The next INR is due 11/29/2010.  Anticoagulation instructions were given to patient.  Results were reviewed/authorized by Vashti Hey, RN.  He was notified by Vashti Hey RN.         Prior Anticoagulation Instructions: INR 3.3 Take coumadin 7.5mg  tonight then resume 5mg  once daily except 2.5mg  on Fridays  Current Anticoagulation Instructions: INR 4.8 Hold coumadin tonight then resume 5mg  once daily except 2.5mg  on Fridays

## 2010-12-31 NOTE — Medication Information (Signed)
Summary: ccr-lr  Anticoagulant Therapy  Managed by: Vashti Hey, RN PCP: Dr. Scharlene Corn MD: Myrtis Ser MD, Tinnie Gens Indication 1: Aortic Valve Replacement (ICD-V43.3) Indication 2: Atrial Fibrillation (ICD-427.31) Lab [facility] [place] Site: [facility] INR POC 3.4  Dietary changes: no    Health status changes: no    Bleeding/hemorrhagic complications: no    Recent/future hospitalizations: no    Any changes in medication regimen? no    Recent/future dental: no  Any missed doses?: no       Is patient compliant with meds? yes       Allergies: 1)  ! Penicillin 2)  Codeine  Anticoagulation Management History:      The patient is taking warfarin and comes in today for a routine follow up visit.  Positive risk factors for bleeding include an age of 75 years or older and history of CVA/TIA.  The bleeding index is 'intermediate risk'.  Positive CHADS2 values include History of CHF, Age > 15 years old, and Prior Stroke/CVA/TIA.  The start date was 01/22/1994.  Anticoagulation responsible provider: Myrtis Ser MD, Tinnie Gens.  INR POC: 3.4.  Cuvette Lot#: [id number].  Exp: 10/11.    Anticoagulation Management Assessment/Plan:      The patient's current anticoagulation dose is Coumadin 5 mg solr: as directed per Coumadin clinic.  The target INR is 3.5 - 4.5.  The next INR is due 02/08/2010.  Anticoagulation instructions were given to patient.  Results were reviewed/authorized by Vashti Hey, RN.  He was notified by Vashti Hey RN.         Prior Anticoagulation Instructions: INR 4.3 Continue coumadin 5mg  once daily   Current Anticoagulation Instructions: INR 3.4 Take couamdin 7.5mg  tonight then resume 5mg  once daily

## 2010-12-31 NOTE — Progress Notes (Signed)
  Phone Note Call from Patient   Reason for Call: Talk to Doctor Action Taken: Phone Call Completed Summary of Call: Call from patients wife stating that her husband is having hematuria.  He is on coumadin and was seen in the coumadin clinic today and found to have therapeutic INR of 4.1 in the setting prosthetic mechanical valve.  He did not report the hematuria to the coumadin clinic. He wife is concerned because he had another episode of hematuria today after taking evening dose of coumadin.   I have advised her to have her husband seen in ER if hematuria persists to evaluate for UTI, or anemia.  He is to report back to coumadin clinic on Monday to have INR checked and to report hematuria.   Initial call taken by: Joni Reining NP

## 2010-12-31 NOTE — Medication Information (Signed)
Summary: ccr-lr  Anticoagulant Therapy  Managed by: Vashti Hey, RN PCP: Dr. Scharlene Corn MD: Diona Browner MD, Remi Deter Indication 1: Aortic Valve Replacement (ICD-V43.3) Indication 2: Atrial Fibrillation (ICD-427.31) Lab [facility]: [facility] [place] Site: [facility] INR POC 3.3  Dietary changes: no    Health status changes: no    Bleeding/hemorrhagic complications: no    Recent/future hospitalizations: no    Any changes in medication regimen? no    Recent/future dental: no  Any missed doses?: no       Is patient compliant with meds? yes       Allergies: 1)  ! Penicillin 2)  Codeine  Anticoagulation Management History:      The patient is taking warfarin and comes in today for a routine follow up visit.  Positive risk factors for bleeding include an age of 75 years or older and history of CVA/TIA.  The bleeding index is 'intermediate risk'.  Positive CHADS2 values include History of CHF, Age > 22 years old, and Prior Stroke/CVA/TIA.  The start date was 01/22/1994.  Anticoagulation responsible provider: Diona Browner MD, Remi Deter.  INR POC: 3.3.  Exp: 10/11.    Anticoagulation Management Assessment/Plan:      The patient's current anticoagulation dose is Coumadin 5 mg solr: as directed per Coumadin clinic.  The target INR is 3.5 - 4.5.  The next INR is due 11/08/2010.  Anticoagulation instructions were given to patient.  Results were reviewed/authorized by Vashti Hey, RN.  He was notified by Vashti Hey RN.         Prior Anticoagulation Instructions: INR 4.6 Take coumadin 1/2 tablet tonight then resume 1 tablet once daily except 1/2 tablet on Fridays  Current Anticoagulation Instructions: INR 3.3 Take coumadin 7.5mg  tonight then resume 5mg  once daily except 2.5mg  on Fridays

## 2010-12-31 NOTE — Medication Information (Signed)
Summary: ccr-lr  Anticoagulant Therapy  Managed by: Vashti Hey, RN PCP: Dr. Scharlene Corn MD: Myrtis Ser MD, Tinnie Gens Indication 1: Aortic Valve Replacement (ICD-V43.3) Indication 2: Atrial Fibrillation (ICD-427.31) Lab [facility] [place] Site: [facility] INR POC 3.3  Dietary changes: no    Health status changes: no    Bleeding/hemorrhagic complications: no    Recent/future hospitalizations: no    Any changes in medication regimen? no    Recent/future dental: no  Any missed doses?: no       Is patient compliant with meds? yes       Allergies: 1)  ! Penicillin 2)  Codeine  Anticoagulation Management History:      The patient is taking warfarin and comes in today for a routine follow up visit.  Positive risk factors for bleeding include an age of 75 years or older and history of CVA/TIA.  The bleeding index is 'intermediate risk'.  Positive CHADS2 values include History of CHF, Age > 39 years old, and Prior Stroke/CVA/TIA.  The start date was 01/22/1994.  Anticoagulation responsible Zackaria Burkey: Myrtis Ser MD, Tinnie Gens.  INR POC: 3.3.  Cuvette Lot#: [id number].  Exp: 10/11.    Anticoagulation Management Assessment/Plan:      The patient's current anticoagulation dose is Coumadin 5 mg solr: as directed per Coumadin clinic.  The target INR is 3.5 - 4.5.  The next INR is due 07/26/2010.  Anticoagulation instructions were given to patient.  Results were reviewed/authorized by Vashti Hey, RN.  He was notified by Vashti Hey RN.         Prior Anticoagulation Instructions: INR 3.2 Take coumadin 7.5mg  tonight then resume 5mg  once daily except 7.5mg  on Friday  Current Anticoagulation Instructions: INR 3.3 Increase coumadin to 5mg  once daily except 7.5mg  on Tuesdays and Fridays

## 2010-12-31 NOTE — Medication Information (Signed)
Summary: ccr-lr  Anticoagulant Therapy  Managed by: Vashti Hey, RN PCP: Dr. Scharlene Corn MD: Andee Lineman MD, Michelle Piper Indication 1: Aortic Valve Replacement (ICD-V43.3) Indication 2: Atrial Fibrillation (ICD-427.31) Lab [facility] [place] Site: [facility] INR POC 4.3  Dietary changes: no    Health status changes: no    Bleeding/hemorrhagic complications: no    Recent/future hospitalizations: no    Any changes in medication regimen? yes       Details: on Zpack for cold per Dr Ouida Sills  Recent/future dental: no  Any missed doses?: no       Is patient compliant with meds? yes       Allergies: 1)  ! Penicillin 2)  Codeine  Anticoagulation Management History:      The patient is taking warfarin and comes in today for a routine follow up visit.  Positive risk factors for bleeding include an age of 75 years or older and history of CVA/TIA.  The bleeding index is 'intermediate risk'.  Positive CHADS2 values include History of CHF, Age > 42 years old, and Prior Stroke/CVA/TIA.  The start date was 01/22/1994.  Anticoagulation responsible provider: Andee Lineman MD, Michelle Piper.  INR POC: 4.3.  Cuvette Lot#: [id number].  Exp: 10/11.    Anticoagulation Management Assessment/Plan:      The patient's current anticoagulation dose is Coumadin 5 mg solr: as directed per Coumadin clinic.  The target INR is 3.5 - 4.5.  The next INR is due 01/25/2010.  Anticoagulation instructions were given to patient.  Results were reviewed/authorized by Vashti Hey, RN.  He was notified by Vashti Hey RN.         Prior Anticoagulation Instructions: INR 4.6 Take coumadin 2.5mg  tonight then resume 5mg  once daily   Current Anticoagulation Instructions: INR 4.3 Continue coumadin 5mg  once daily

## 2010-12-31 NOTE — Letter (Signed)
Summary: [employer] [facility], agricultural at [facility] [address]   [place], [place] [address]   Phone: [phone]  Fax: [phone]    02/08/2010  Charls Due [address] [place], [place]  [address]  Dear Mr. Nichelson,   Enclosed is the copy of the order for your 2-D Echol that Dr. Andee Lineman has requested you to have. If this date and time are not suitable please contact our office at [phone] ext. [phone] and I will be happy to re-schedule.        Sincerely,   Zachary George Patient Care Coordinator

## 2010-12-31 NOTE — Medication Information (Signed)
Summary: ccr-lr  Anticoagulant Therapy  Managed by: Vashti Hey, RN PCP: Dr. Scharlene Corn MD: Andee Lineman MD, Michelle Piper Indication 1: Aortic Valve Replacement (ICD-V43.3) Indication 2: Atrial Fibrillation (ICD-427.31) Lab [facility] [place] Site: [facility] INR POC 5.0  Dietary changes: no    Health status changes: no    Bleeding/hemorrhagic complications: no    Recent/future hospitalizations: no    Any changes in medication regimen? no    Recent/future dental: no  Any missed doses?: no       Is patient compliant with meds? yes       Allergies: 1)  ! Penicillin 2)  Codeine  Anticoagulation Management History:      The patient is taking warfarin and comes in today for a routine follow up visit.  Positive risk factors for bleeding include an age of 75 years or older and history of CVA/TIA.  The bleeding index is 'intermediate risk'.  Positive CHADS2 values include History of CHF, Age > 27 years old, and Prior Stroke/CVA/TIA.  The start date was 01/22/1994.  Anticoagulation responsible provider: Andee Lineman MD, Michelle Piper.  INR POC: 5.0.  Cuvette Lot#: [id number].  Exp: 10/11.    Anticoagulation Management Assessment/Plan:      The patient's current anticoagulation dose is Coumadin 5 mg solr: as directed per Coumadin clinic.  The target INR is 3.5 - 4.5.  The next INR is due 10/01/2010.  Anticoagulation instructions were given to patient.  Results were reviewed/authorized by Vashti Hey, RN.  He was notified by Vashti Hey RN.         Prior Anticoagulation Instructions: INR 4.6 Take coumadin 1/2 tablet tonight then decrease dose to 1 tablet once daily   Current Anticoagulation Instructions: INR 5.0 Hold coumadin tonight then decrease dose to 5mg  once daily except 2.5mg  on Fridays

## 2010-12-31 NOTE — Consult Note (Signed)
[name], [name]                ACCOUNT NO.:  [id number]  MEDICAL RECORD NO.:  [id number]          PATIENT TYPE:  INP  LOCATION:  3711                         FACILITY:  [facility]  PHYSICIAN:  Veverly Fells. Excell Seltzer, MD  DATE OF BIRTH:  Mar 04, 1932  DATE OF CONSULTATION:  12/17/2010 DATE OF DISCHARGE:                                CONSULTATION   PRIMARY CARE PHYSICIAN:  Kingsley Callander. Ouida Sills, MD  PRIMARY CARDIOLOGIST:  Learta Codding, MD, [facility] in [place].  CHIEF COMPLAINT:  Pauses with atrial fibrillation.  HISTORY OF PRESENT ILLNESS:  [name] is a 77 year old male with a history of atrial fibrillation and mechanical heart valve.  He was admitted on January 13 with nausea, fever, and abdominal pain with some vomiting.  He was seen by GI and an ERCP is scheduled for today.  He is on his home dose of Lopressor.  Last p.m. while asleep, he had a 2.14 and 2.75 seconds pause.  He was asymptomatic.  Cardiology was asked to evaluate him.  Mr. Ferreri never has palpitations.  Upon review of telemetry, he is in atrial fibrillation with the above-mentioned pauses and has occasional PVC's in pairs.  He gets occasional orthostatic dizziness and has had some brief dizziness with rapid turning of his head, but these symptoms cleared quickly.  He denies presyncope.  He has not fallen.  He never gets palpitations and has had no chest pain or shortness of breath. Currently, he is resting comfortably.  PAST MEDICAL HISTORY: 1. Status post St. Jude mechanical aortic valve and Starr-Edwards     mitral valve in 1990. 2. Permanent atrial fibrillation. 3. Chronic anticoagulation with Coumadin, goal INR 3.5-4.5. 4. History of CVA, while Coumadin was subtherapeutic. 5. Rheumatoid arthritis. 6. Hypertension. 7. Diabetes. 8. Hypothyroidism. 9. Status post echocardiogram in 2011, showing an EF of 40-45%, valve     functioning well and no regional wall motion abnormalities.  SURGICAL HISTORY:  He is status post  open heart surgery as well as cholecystectomy, ERCP x2, and multiple orthopedic procedures because of this rheumatoid.  ALLERGIES:  He is allergic or intolerant to CODEINE, PENICILLIN, and LIPITOR.  CURRENT MEDICATIONS: 1. Coumadin is on hold. 2. Aspirin 81 mg a day. 3. Ertapenem 1 g daily. 4. Lasix 20 mg p.o. daily. 5. IV heparin, turned off for the procedure. 6. Synthroid 125 mcg daily. 7. Metoprolol 12.5 mg b.i.d. 8. Multivitamin daily. 9. Protonix 40 mg a day. 10.MiraLax 17 g daily. 11.Prednisone 20 mg a day. 12.Altace 10 mg a day. 13.IV fluids. 14.Hytrin 2 mg at bedtime.  SOCIAL HISTORY:  He lives in [place] with his wife.  He is disabled. He has approximately 30-pack-year history of tobacco use, but quit 30 years ago.  He denies alcohol or drug abuse.  FAMILY HISTORY:  Noncontributory.  REVIEW OF SYSTEMS:  He had some chills prior to admission.  He has some chronic dyspnea on exertion and is deconditioned because of his orthopedic limitations, but none of this is changed recently.  He coughs occasionally, but it is nonproductive.  He has nocturia 1 or 2 times at night.  He  has had some problems with depression in the past.  He has memory problems.  He has chronic arthralgias and multiple joint pains. He had nausea and some dry heaves prior to admission and brought up some bilious vomitus.  Full 14-point review of systems is otherwise negative except as stated in the HPI.  PHYSICAL EXAMINATION:  VITAL SIGNS:  Temperature is 97.7, blood pressure 113/65, pulse 70, respiratory rate 20, O2 saturation 95% on room air. GENERAL:  He is a frail elderly white male, in no acute distress at rest. HEENT:  Normal. NECK:  There is no lymphadenopathy, thyromegaly, bruit, or JVD noted. CVA:  His heart is irregular in rate and rhythm with an S1 and S2, and 2 valve clicks are noted.  He has decreased peripheral pulses generally, but they are palpable. LUNGS:  He has bibasilar  rales. SKIN:  No rashes or lesions are noted. ABDOMEN:  Soft and nontender with active bowel sounds. EXTREMITIES:  There is no cyanosis, clubbing, or edema noted. MUSCULOSKELETAL:  He has multiple joint deformities, but no effusions. NEURO:  He is alert and oriented with cranial nerves II through XII grossly intact.  ERCP shows filling defects in the common bile duct that were eliminated after balloon sweep maneuver.  Duct was widely patent upon completion.  EKG is atrial fibrillation, rate 75 with inferolateral T-wave changes, but no old is available for comparison.  LABORATORY VALUES:  Hemoglobin of 11.3, hematocrit 34.7, WBC is 8.1, platelets 366.  Sodium 137, potassium 4.8, chloride 104, CO2 28, BUN 12, creatinine 0.86, glucose 86, INR now 1.04, magnesium 2.7.  IMPRESSION:  Mr. Sebald was seen today by Dr. Excell Seltzer, the patient evaluated and the data reviewed including the telemetry strips.  There are no pathologic pauses.  His heart rate was not sustained less than 50 even while asleep.  He can be continued on his current medications.  The case was reviewed with Dr. Andee Lineman and the literature supports Lovenox asequally effective to heparin in anticoagulation even with a mitral valve replacement.  After the ERCP, heparin was ordered to be restarted as well as Coumadin.  We agree with unfractionated heparin early on since he has had postprocedure bleeding in the past.  We could consider switching to Lovenox in 1-2 days to avoid a prolonged hospital stay if this can be arranged.  He will need bridging with either Lovenox or heparin until his INR is greater than or equal to 3.0.     Theodore Demark, PA-C   ______________________________ Veverly Fells. Excell Seltzer, MD    RB/MEDQ  D:  12/17/2010  T:  12/18/2010  Job:  [id number]  Electronically Signed by Theodore Demark PA-C on 12/23/2010 05:41:58 PM Electronically Signed by Tonny Bollman MD on 12/31/2010 04:54:23 AM

## 2010-12-31 NOTE — Medication Information (Signed)
Summary: ccr-lr  Anticoagulant Therapy  Managed by: Vashti Hey, RN PCP: Dr. Scharlene Corn MD: Antoine Poche MD, Fayrene Fearing Indication 1: Aortic Valve Replacement (ICD-V43.3) Indication 2: Atrial Fibrillation (ICD-427.31) Lab [facility] [place] Site: [place] INR POC 3.7  Dietary changes: no    Health status changes: no    Bleeding/hemorrhagic complications: no    Recent/future hospitalizations: no    Any changes in medication regimen? no    Recent/future dental: no  Any missed doses?: no       Is patient compliant with meds? yes       Allergies: 1)  ! Penicillin 2)  Codeine  Anticoagulation Management History:      The patient is taking warfarin and comes in today for a routine follow up visit.  Positive risk factors for bleeding include an age of 75 years or older and history of CVA/TIA.  The bleeding index is 'intermediate risk'.  Positive CHADS2 values include History of CHF, Age > 5 years old, and Prior Stroke/CVA/TIA.  The start date was 01/22/1994.  Anticoagulation responsible provider: Antoine Poche MD, Fayrene Fearing.  INR POC: 3.7.  Cuvette Lot#: [id number].  Exp: 10/11.    Anticoagulation Management Assessment/Plan:      The patient's current anticoagulation dose is Coumadin 5 mg solr: as directed per Coumadin clinic.  The target INR is 3.5 - 4.5.  The next INR is due 04/09/2010.  Anticoagulation instructions were given to patient.  Results were reviewed/authorized by Vashti Hey, RN.  He was notified by Vashti Hey RN.         Prior Anticoagulation Instructions: INR 3.9 Continue coumadin 5mg  once daily   Current Anticoagulation Instructions: INR 3.7 Take coumadin 7.5mg  tonight then resume 5mg  once daily

## 2010-12-31 NOTE — Medication Information (Signed)
Summary: ccr-lr  Anticoagulant Therapy  Managed by: Vashti Hey, RN PCP: Dr. Scharlene Corn MD: Andee Lineman MD, Michelle Piper Indication 1: Aortic Valve Replacement (ICD-V43.3) Indication 2: Atrial Fibrillation (ICD-427.31) Lab [facility] [place] Site: [place] INR POC 3.2  Dietary changes: no    Health status changes: no    Bleeding/hemorrhagic complications: no    Recent/future hospitalizations: no    Any changes in medication regimen? no    Recent/future dental: no  Any missed doses?: no       Is patient compliant with meds? yes       Allergies: 1)  ! Penicillin 2)  Codeine  Anticoagulation Management History:      The patient is taking warfarin and comes in today for a routine follow up visit.  Positive risk factors for bleeding include an age of 75 years or older and history of CVA/TIA.  The bleeding index is 'intermediate risk'.  Positive CHADS2 values include History of CHF, Age > 55 years old, and Prior Stroke/CVA/TIA.  The start date was 01/22/1994.  Anticoagulation responsible provider: Andee Lineman MD, Michelle Piper.  INR POC: 3.2.  Exp: 10/11.    Anticoagulation Management Assessment/Plan:      The patient's current anticoagulation dose is Coumadin 5 mg solr: as directed per Coumadin clinic.  The target INR is 3.5 - 4.5.  The next INR is due 05/31/2010.  Anticoagulation instructions were given to patient.  Results were reviewed/authorized by Vashti Hey, RN.  He was notified by Vashti Hey RN.         Prior Anticoagulation Instructions: INR 3.1 Take coumadin 7.5mg  x 2 then resume 5mg  once daily   Current Anticoagulation Instructions: INR 3.2 Increase coumadin to 5mg  once daily except 7.5mg  on Fridays

## 2011-01-01 ENCOUNTER — Ambulatory Visit: Admit: 2011-01-01 | Payer: Self-pay | Admitting: Cardiology

## 2011-01-01 ENCOUNTER — Ambulatory Visit (INDEPENDENT_AMBULATORY_CARE_PROVIDER_SITE_OTHER): Payer: Medicare Other | Admitting: Cardiology

## 2011-01-01 ENCOUNTER — Encounter: Payer: Self-pay | Admitting: Cardiology

## 2011-01-01 DIAGNOSIS — I059 Rheumatic mitral valve disease, unspecified: Secondary | ICD-10-CM | POA: Insufficient documentation

## 2011-01-01 DIAGNOSIS — R7881 Bacteremia: Secondary | ICD-10-CM | POA: Insufficient documentation

## 2011-01-01 DIAGNOSIS — I359 Nonrheumatic aortic valve disorder, unspecified: Secondary | ICD-10-CM | POA: Insufficient documentation

## 2011-01-02 NOTE — Progress Notes (Signed)
Summary: coumadin management  Phone Note Other Incoming   Caller: Jessica RN [facility] Reason for Call: Discuss lab or test results Summary of Call: Called with results of PT/INR obtained on pt today.  PT 21.7  INR 1.8   Pt was d/c from [facility] on 12/21/10 on coumadin 7.5mg  once daily and Lovenox 70mg  two times a day.  He was in hospital for recurrent acute colangitis with ERCP.  He is on Cipro 500mg  x 7 days.  Will finish 12/28/10.  Order given for pt to take coumadin 10mg  tonight then resume 7.5mg  once daily and continue Lovenox.  Pt has appt with Dr Andee Lineman tomorrow 1/24 and INR will be rechecked then.  Order given for [facility] to recheck INR on 12/26/10.  Initial call taken by: Vashti Hey RN,  December 23, 2010 11:55 AM     Anticoagulant Therapy  Managed by: Vashti Hey, RN PCP: Dr. Scharlene Corn MD: Dietrich Pates MD, Molly Maduro Indication 1: Aortic Valve Replacement (ICD-V43.3) Indication 2: Atrial Fibrillation (ICD-427.31) Lab [facility] Site: [place] PT 21.7 INR POC 1.8  Dietary changes: no    Health status changes: no    Bleeding/hemorrhagic complications: no    Recent/future hospitalizations: yes       Details: In [facility] 1/17 - 12/21/10 for acute cholangitis with ERCP  Any changes in medication regimen? yes       Details: On cipro 500mg  x 7 days  Recent/future dental: no  Any missed doses?: yes     Details: d/c on coumadin 7.5mg  qd and Lovenox 70mg  bid  Is patient compliant with meds? yes         Anticoagulation Management History:      His anticoagulation is being managed by telephone today.  Positive risk factors for bleeding include an age of 75 years or older and history of CVA/TIA.  The bleeding index is 'intermediate risk'.  Positive CHADS2 values include History of CHF, Age > 86 years old, and Prior Stroke/CVA/TIA.  The start date was 01/22/1994.  Prothrombin time is 21.7.  Anticoagulation responsible provider: Dietrich Pates MD, Molly Maduro.  INR POC: 1.8.  Exp: 10/11.     Anticoagulation Management Assessment/Plan:      The patient's current anticoagulation dose is Coumadin 5 mg solr: as directed per Coumadin clinic.  The target INR is 3.5 - 4.5.  The next INR is due 12/24/2010.  Anticoagulation instructions were given to [facility].  Results were reviewed/authorized by Vashti Hey, RN.  He was notified by [facility].         Prior Anticoagulation Instructions: INR 3.8 Take coumadin 5mg  on Tuesday and Wednesdays, 2.5mg  on Thursday and recheck on Friday Will finish Cipro 500mg  two times a day on 12/16/10  Current Anticoagulation Instructions: INR 1.8 Called with results of PT/INR obtained on pt today.  PT 21.7  INR 1.8   Pt was d/c from [facility] on 12/21/10 on coumadin 7.5mg  once daily and Lovenox 70mg  two times a day.  He was in hospital for recurrent acute colangitis with ERCP.  He is on Cipro 500mg  x 7 days.  Will finish 12/28/10.  Order given for pt to take coumadin 10mg  tonight then resume 7.5mg  once daily and continue Lovenox.  Pt has appt with Dr Andee Lineman tomorrow 1/24 and INR will be rechecked then.  Order given for [facility] to recheck INR on 12/26/10.

## 2011-01-02 NOTE — Medication Information (Signed)
Summary: ccrv, post hosp  --agh  Anticoagulant Therapy  Managed by: Vashti Hey, RN PCP: Dr. Scharlene Corn MD: Diona Browner MD, Remi Deter Indication 1: Aortic Valve Replacement (ICD-V43.3) Indication 2: Atrial Fibrillation (ICD-427.31) Lab [facility] [place] Site: [place] INR POC 2.5  Dietary changes: no    Health status changes: no    Bleeding/hemorrhagic complications: no    Recent/future hospitalizations: yes       Details: Been in [facility] for acute cholangitis and ERCP  Any changes in medication regimen? yes       Details: On Abx x 8 days  Recent/future dental: no  Any missed doses?: no       Is patient compliant with meds? yes       Allergies: 1)  ! Penicillin 2)  Codeine  Anticoagulation Management History:      The patient is taking warfarin and comes in today for a routine follow up visit.  Positive risk factors for bleeding include an age of 75 years or older and history of CVA/TIA.  The bleeding index is 'intermediate risk'.  Positive CHADS2 values include History of CHF, Age > 75 years old, and Prior Stroke/CVA/TIA.  The start date was 01/22/1994.  Anticoagulation responsible provider: Diona Browner MD, Remi Deter.  INR POC: 2.5.  Cuvette Lot#: [id number].  Exp: 10/11.    Anticoagulation Management Assessment/Plan:      The patient's current anticoagulation dose is Coumadin 5 mg solr: as directed per Coumadin clinic.  The target INR is 3.5 - 4.5.  The next INR is due 12/25/2010.  Anticoagulation instructions were given to patient.  Results were reviewed/authorized by Vashti Hey, RN.  He was notified by Vashti Hey RN.         Prior Anticoagulation Instructions: INR 1.8 Called with results of PT/INR obtained on pt today.  PT 21.7  INR 1.8   Pt was d/c from [facility] on 12/21/10 on coumadin 7.5mg  once daily and Lovenox 70mg  two times a day.  He was in hospital for recurrent acute colangitis with ERCP.  He is on Cipro 500mg  x 7 days.  Will finish 12/28/10.  Order given for pt to  take coumadin 10mg  tonight then resume 7.5mg  once daily and continue Lovenox.  Pt has appt with Dr Andee Lineman tomorrow 1/24 and INR will be rechecked then.  Order given for [facility] to recheck INR on 12/26/10.   Current Anticoagulation Instructions: INR 2.5 Take coumadin 7.5mg  tonight  Continue Lovenox 70mg  two times a day [facility] to recheck INR 12/25/10

## 2011-01-02 NOTE — Medication Information (Signed)
Summary: coumadin management  Anticoagulant Therapy  Managed by: Vashti Hey, RN PCP: Dr. Scharlene Corn MD: Dietrich Pates MD, Molly Maduro Indication 1: Aortic Valve Replacement (ICD-V43.3) Indication 2: Atrial Fibrillation (ICD-427.31) Lab Used: [facility]  Site: [place] INR POC 3.2  Dietary changes: no    Health status changes: no    Bleeding/hemorrhagic complications: no    Recent/future hospitalizations: no    Any changes in medication regimen? yes       Details: Pt still on Lovenox 70mg  bid  Recent/future dental: no  Any missed doses?: no       Is patient compliant with meds? yes       Allergies: 1)  ! Penicillin 2)  Codeine  Anticoagulation Management History:      His anticoagulation is being managed by telephone today.  Positive risk factors for bleeding include an age of 75 years or older and history of CVA/TIA.  The bleeding index is 'intermediate risk'.  Positive CHADS2 values include History of CHF, Age > 75 years old, and Prior Stroke/CVA/TIA.  The start date was 01/22/1994.  Anticoagulation responsible provider: Dietrich Pates MD, Molly Maduro.  INR POC: 3.2.  Exp: 10/11.    Anticoagulation Management Assessment/Plan:      The patient's current anticoagulation dose is Coumadin 5 mg solr: as directed per Coumadin clinic.  The target INR is 3.5 - 4.5.  The next INR is due 12/27/2010.  Anticoagulation instructions were given to patient.  Results were reviewed/authorized by Vashti Hey, RN.  He was notified by Vashti Hey RN.         Prior Anticoagulation Instructions: INR 2.5 Take coumadin 7.5mg  tonight  Continue Lovenox 70mg  two times a day [facility] to recheck INR 12/25/10  Current Anticoagulation Instructions: INR 3.2 Take coumadin 5mg  tonight and tomorrow night  Stop Lovenox Recheck INR on 12/27/10

## 2011-01-02 NOTE — Medication Information (Signed)
Summary: ccr-lr  Anticoagulant Therapy  Managed by: Vashti Hey, RN PCP: Dr. Scharlene Corn MD: Diona Browner MD, Remi Deter Indication 1: Aortic Valve Replacement (ICD-V43.3) Indication 2: Atrial Fibrillation (ICD-427.31) Lab [facility]: [facility] Site: [place] INR POC 1.5  Dietary changes: no    Health status changes: no    Bleeding/hemorrhagic complications: no    Recent/future hospitalizations: no    Any changes in medication regimen? no    Recent/future dental: no  Any missed doses?: no       Is patient compliant with meds? yes       Allergies: 1)  ! Penicillin 2)  Codeine  Anticoagulation Management History:      The patient is taking warfarin and comes in today for a routine follow up visit.  Positive risk factors for bleeding include an age of 75 years or older and history of CVA/TIA.  The bleeding index is 'intermediate risk'.  Positive CHADS2 values include History of CHF, Age > 62 years old, and Prior Stroke/CVA/TIA.  The start date was 01/22/1994.  Anticoagulation responsible provider: Diona Browner MD, Remi Deter.  INR POC: 1.5.  Cuvette Lot#: [id number].  Exp: 10/11.    Anticoagulation Management Assessment/Plan:      The patient's current anticoagulation dose is Coumadin 5 mg solr: as directed per Coumadin clinic.  The target INR is 3.5 - 4.5.  The next INR is due 12/12/2010.  Anticoagulation instructions were given to patient.  Results were reviewed/authorized by Vashti Hey, RN.  He was notified by Vashti Hey RN.         Prior Anticoagulation Instructions: INR 7.6 Pt denies being on any new meds  Confirmed pt does have 5mg  tablet and has been taking correctly. Hold coumadin x 3 nights, take 2.5mg  on Monday and recheck INR on Tuesday  Current Anticoagulation Instructions: INR 1.5 Take coumadin 2 tablets today, 1 1/2 tomorrow then reseume 1 tablet once daily except 1/2 tablet on Fridays

## 2011-01-02 NOTE — Progress Notes (Signed)
Summary: coumadin management  Phone Note Other Incoming   Caller: Ernesto Rutherford RN  Reason for Call: Discuss lab or test results Summary of Call: Called with results of INR obtained on pt today.  INR 3.4  Order given for pt to take coumadin 7.5mg  tonight then take 5mg  once daily and recheck INR on 12/31/10.  Initial call taken by: Vashti Hey RN,  December 27, 2010 1:17 PM     Anticoagulant Therapy  Managed by: Vashti Hey, RN PCP: Dr. Scharlene Corn MD: Andee Lineman MD, Michelle Piper Indication 1: Aortic Valve Replacement (ICD-V43.3) Indication 2: Atrial Fibrillation (ICD-427.31) Lab Used: [facility] Site: [place] INR POC 3.4  Dietary changes: no    Health status changes: no    Bleeding/hemorrhagic complications: no    Recent/future hospitalizations: no    Any changes in medication regimen? no    Recent/future dental: no  Any missed doses?: no       Is patient compliant with meds? yes         Anticoagulation Management History:      His anticoagulation is being managed by telephone today.  Positive risk factors for bleeding include an age of 75 years or older and history of CVA/TIA.  The bleeding index is 'intermediate risk'.  Positive CHADS2 values include History of CHF, Age > 46 years old, and Prior Stroke/CVA/TIA.  The start date was 01/22/1994.  Anticoagulation responsible provider: Andee Lineman MD, Michelle Piper.  INR POC: 3.4.  Exp: 10/11.    Anticoagulation Management Assessment/Plan:      The patient's current anticoagulation dose is Coumadin 5 mg solr: as directed per Coumadin clinic.  The target INR is 3.5 - 4.5.  The next INR is due 12/31/2010.  Anticoagulation instructions were given to Ernesto Rutherford RN [facility].  Results were reviewed/authorized by Vashti Hey, RN.  He was notified by Ernesto Rutherford RN [facility].         Prior Anticoagulation Instructions: INR 3.2 Take coumadin 5mg  tonight and tomorrow night  Stop Lovenox Recheck INR on 12/27/10   Current Anticoagulation  Instructions: INR 3.4 Called with results of INR obtained on pt today.  INR 3.4  Order given for pt to take coumadin 7.5mg  tonight then take 5mg  once daily and recheck INR on 12/31/10.

## 2011-01-02 NOTE — Medication Information (Signed)
Summary: ccrv -agh  Anticoagulant Therapy  Managed by: Vashti Hey, RN PCP: Dr. Scharlene Corn MD: Andee Lineman MD, Michelle Piper Indication 1: Aortic Valve Replacement (ICD-V43.3) Indication 2: Atrial Fibrillation (ICD-427.31) Lab [facility] Site: [place] INR POC 3.8  Dietary changes: no    Health status changes: no    Bleeding/hemorrhagic complications: no    Recent/future hospitalizations: yes       Details: Was in [facility] 1/4 - 1/7 with cholangitis  Any changes in medication regimen? yes       Details: on Cipro 500mg  bid x 10 days  Recent/future dental: no  Any missed doses?: no       Is patient compliant with meds? yes       Allergies: 1)  ! Penicillin 2)  Codeine  Anticoagulation Management History:      The patient is taking warfarin and comes in today for a routine follow up visit.  Positive risk factors for bleeding include an age of 34 years or older and history of CVA/TIA.  The bleeding index is 'intermediate risk'.  Positive CHADS2 values include History of CHF, Age > 57 years old, and Prior Stroke/CVA/TIA.  The start date was 01/22/1994.  Anticoagulation responsible provider: Andee Lineman MD, Michelle Piper.  INR POC: 3.8.  Exp: 10/11.    Anticoagulation Management Assessment/Plan:      The patient's current anticoagulation dose is Coumadin 5 mg solr: as directed per Coumadin clinic.  The target INR is 3.5 - 4.5.  The next INR is due 12/13/2010.  Anticoagulation instructions were given to patient.  Results were reviewed/authorized by Vashti Hey, RN.  He was notified by Vashti Hey RN.         Prior Anticoagulation Instructions: INR 1.5 Take coumadin 2 tablets today, 1 1/2 tomorrow then reseume 1 tablet once daily except 1/2 tablet on Fridays  Current Anticoagulation Instructions: INR 3.8 Take coumadin 5mg  on Tuesday and Wednesdays, 2.5mg  on Thursday and recheck on Friday Will finish Cipro 500mg  two times a day on 12/16/10

## 2011-01-02 NOTE — Miscellaneous (Signed)
Summary: Home Care Report/ [facility]  Home Care Report/ [facility]   Imported By: Dorise Hiss 12/25/2010 10:37:07  _____________________________________________________________________  External Attachment:    Type:   Image     Comment:   External Document

## 2011-01-02 NOTE — Medication Information (Signed)
Summary: ccr-lr  Anticoagulant Therapy  Managed by: Vashti Hey, RN PCP: Dr. Scharlene Corn MD: Diona Browner MD, Remi Deter Indication 1: Aortic Valve Replacement (ICD-V43.3) Indication 2: Atrial Fibrillation (ICD-427.31) Lab Used: [facility] Site: [place] INR POC 7.6  Dietary changes: no    Health status changes: no    Bleeding/hemorrhagic complications: no    Recent/future hospitalizations: no    Any changes in medication regimen? no    Recent/future dental: no  Any missed doses?: no       Is patient compliant with meds? yes       Allergies: 1)  ! Penicillin 2)  Codeine  Anticoagulation Management History:      The patient is taking warfarin and comes in today for a routine follow up visit.  Positive risk factors for bleeding include an age of 3 years or older and history of CVA/TIA.  The bleeding index is 'intermediate risk'.  Positive CHADS2 values include History of CHF, Age > 78 years old, and Prior Stroke/CVA/TIA.  The start date was 01/22/1994.  Anticoagulation responsible Shermon Bozzi: Diona Browner MD, Remi Deter.  INR POC: 7.6.  Cuvette Lot#: [id number].  Exp: 10/11.    Anticoagulation Management Assessment/Plan:      The patient's current anticoagulation dose is Coumadin 5 mg solr: as directed per Coumadin clinic.  The target INR is 3.5 - 4.5.  The next INR is due 12/03/2010.  Anticoagulation instructions were given to patient.  Results were reviewed/authorized by Vashti Hey, RN.  He was notified by Vashti Hey RN.         Prior Anticoagulation Instructions: INR 4.8 Hold coumadin tonight then resume 5mg  once daily except 2.5mg  on Fridays  Current Anticoagulation Instructions: INR 7.6 Pt denies being on any new meds  Confirmed pt does have 5mg  tablet and has been taking correctly. Hold coumadin x 3 nights, take 2.5mg  on Monday and recheck INR on Tuesday

## 2011-01-02 NOTE — Letter (Signed)
Summary: Appointment - Missed  [facility] [place]  [address] [place] [address]   Phone: [phone]  Fax: [phone]     December 13, 2010 MRN: [id number]   [facility] [address] [place], [place]  [address]   Dear [name],  Our records indicate you missed your appointment on       12/13/10 COUMADIN CLINIC               It is very important that we reach you to reschedule this appointment. We look forward to participating in your health care needs. Please contact us at the number listed above at your earliest convenience to reschedule this appointment.     Sincerely,    Glass blower/designer

## 2011-01-02 NOTE — Miscellaneous (Signed)
Summary: Home Care Report/ [facility]  Home Care Report/ [facility]   Imported By: Dorise Hiss 12/25/2010 10:38:17  _____________________________________________________________________  External Attachment:    Type:   Image     Comment:   External Document

## 2011-01-02 NOTE — Progress Notes (Signed)
Summary: weakness, unable to get out of bed   Phone Note Other Incoming   Caller: wife - Earley Abide Summary of Call: [name] states husband is really sick.  INR numbers have been up and down lately.  Was in to see [facility] yesterday.  States that he can't even get out of bed due to feeling weak and pain from his arthritis.  States he did fall last p.m. when he got up to go to bathroom so she did call EMS.  States he did not hit his head because she got to him in time.  EMS crew helped get him back in bed, but patient told them he was okay and declined to go to hospital.  Also, states that he did have OV with his PMD this morning for his regular f/u, but she did not feel that she could physically get him there by herself.  Advised her to call 911/EMS to take him to ED for evaluation.  Wife verbalized understanding.  Initial call taken by: Hoover Brunette, LPN,  December 04, 2010 9:44 AM

## 2011-01-02 NOTE — Discharge Summary (Signed)
[name], [name]                ACCOUNT NO.:  [id number]  MEDICAL RECORD NO.:  [id number]          PATIENT TYPE:  INP  LOCATION:  3711                         FACILITY:  [name]  PHYSICIAN:  [name], M.D.   DATE OF BIRTH:  [birth year]  DATE OF ADMISSION:  12/13/2010 DATE OF DISCHARGE:                              DISCHARGE SUMMARY   DISCHARGE DATE:  To be determined.  DISCHARGE DIAGNOSES: 1. Cholangitis, recurrent.  The patient now with normal LFTs, for     endoscopic retrograde cholangiopancreatography today, initial     presentation at [name].  The patient was admitted from     the 4th through to the 7th and then readmitted here at [name]     from the 13th and discharge is to be determined. 2. Atrial fibrillation, rate controlled. 3. Rheumatoid arthritis. 4. Prosthetic mitral and aortic valve, based on reports from     Cardiology at [name], Dr. Andee Rogers, goal of INR 3.5-4.5.  the patient     is on heparin while Coumadin is on hold. 5. Escherichia coli bacteremia. While in [name], blood cultures, July 4     to July 7, the patient was discharged home on Cipro based on     sensitivities and the patient is now on IV Invanz per discussion     with Dr. Daiva Rogers, ID.  Discharge medications will be done at that time the patient he has been discharged.  PROCEDURES:  The patient to have ERCP done today per [facility].  CONSULTANTS:  [name].  HISTORY OF PRESENT ILLNESS:  The patient is a 77 year old male with past medical history significant for valvular heart disease, aortic and mitral valve replacement in the 1990s.  He also has diet-controlled diabetes, rheumatoid arthritis, hypothyroidism.  The patient was recently at [name] secondary to cholangitis from the 4th through the 7th where he had abdominal pain, nausea, vomiting, fevers, blood cultures growing out E. coli.  He was treated with antibiotics during that time and sent home on Cipro.  Based on  recommendations, he was to follow up with [facility] outpatient for ERCP, but he was seen by his PCP in the office on the 16th and was having recurrent abdominal pain, so he was sent to [name] by his PCP for further management and ERCP.  The patient seen by [name] and is scheduled for an ERCP today.  Past medical history, family history, social history, meds, allergies, and review of systems are per admission H&P.  PHYSICAL EXAMINATION:  VITAL SIGNS:  At the time of last exam, temperature 97.7, pulse 70, respirations 20, blood pressure 113/65, pulse ox 95% on room air. GENERAL:  The patient is sitting up in chair, well-nourished white male. HEENT:  Normocephalic, atraumatic.  Pupils reactive to light.  Throat without erythema. CARDIOVASCULAR:  He has valvular click. LUNGS:  Clear bilaterally. ABDOMEN:  Positive bowel sounds. EXTREMITIES:  He does have rheumatic arthritis changes of his extremities.  Fingers are deviated.  HOSPITAL COURSE: 1. Cholangitis:  The patient was admitted to the hospital, made n.p.o.     initially, then diet was  started per Rogers.  His LFTs trended down,     now they are back to normal.  His abdominal pain has resolved.  He     was initially on Cipro.  Discussed with Dr. Daiva Rogers whether he     needs any cardiac evaluation to rule out endocarditis, but per her     discussion, E. coli endocarditis very rare, so if the patient is     getting better with blood cultures repeat negative, then the     patient is okay to go on Invanz IV for broader __________ coverage     and okay not to do any cardiac workup since the patient does not     have any chest pain and blood cultures repeat are negative.  The     patient to have ERCP done today and then discharge as per     Cardiology recommendation. 2. Valvular heart disease status post valve replacement.  The patient     per looking at Dr. Margarita Rogers note, goal INR 3.5-4.5, so he is     presently on heparin drip while  preparing for the ERCP, sought     suspect that he has to be started back on Coumadin and it has to be     therapeutic prior to him being discharged unless he is going to go     home on not sure if he is a candidate for Lovenox with his     prosthetic valve and Coumadin.  We will defer to the discharging     physician 3. Rheumatoid arthritis.  He was on low-dose prednisone at home, did     increase it to 20 mg per day since he is in a stressful environment     and that can be readjusted at the time of discharge. 4. AFib. He is rate controlled.  Labs at the time of discharge will be     dictated by the rounding physician.     [name], M.D.     [name]  D:  12/17/2010  T:  12/17/2010  Job:  [id number]  Electronically Signed by [name] M.D. on 01/02/2011 01:39:41 PM

## 2011-01-07 ENCOUNTER — Encounter: Payer: Self-pay | Admitting: Cardiology

## 2011-01-07 ENCOUNTER — Telehealth: Payer: Self-pay | Admitting: Cardiology

## 2011-01-08 ENCOUNTER — Encounter: Payer: Self-pay | Admitting: Cardiology

## 2011-01-08 NOTE — Consult Note (Signed)
Summary: Consultation Report/ CARDIOLOGY  Consultation Report/ CARDIOLOGY   Imported By: Dorise Hiss 12/31/2010 16:46:38  _____________________________________________________________________  External Attachment:    Type:   Image     Comment:   External Document

## 2011-01-08 NOTE — Progress Notes (Signed)
Summary: coumadin management  Phone Note Other Incoming   Caller: Ernesto Rutherford RN [facility] Reason for Call: Discuss lab or test results Summary of Call: Called with results of INR obtained on pt today.  INR 4.4  Take coumadin 5mg  once daily except 2.5mg  on Tuesdays and Recheck INR wk of 01/06/11. Initial call taken by: Vashti Hey RN,  December 31, 2010 10:01 AM     Anticoagulant Therapy  Managed by: Vashti Hey, RN Referring MD: Andee Lineman PCP: Dr. Scharlene Corn MD: Andee Lineman MD, Michelle Piper Indication 1: Aortic Valve Replacement (ICD-V43.3) Indication 2: Atrial Fibrillation (ICD-427.31) Lab Used: [facility] Site: [place] INR POC 4.4  Dietary changes: no    Health status changes: no    Bleeding/hemorrhagic complications: no    Recent/future hospitalizations: no    Any changes in medication regimen? no    Recent/future dental: no  Any missed doses?: no       Is patient compliant with meds? yes         Anticoagulation Management History:      His anticoagulation is being managed by telephone today.  Positive risk factors for bleeding include an age of 35 years or older and history of CVA/TIA.  The bleeding index is 'intermediate risk'.  Positive CHADS2 values include History of CHF, Age > 56 years old, and Prior Stroke/CVA/TIA.  The start date was 01/22/1994.  Anticoagulation responsible provider: Andee Lineman MD, Michelle Piper.  INR POC: 4.4.  Exp: 10/11.    Anticoagulation Management Assessment/Plan:      The patient's current anticoagulation dose is Coumadin 5 mg solr: as directed per Coumadin clinic.  The target INR is 3.5 - 4.5.  The next INR is due 01/06/2011.  Anticoagulation instructions were given to Ernesto Rutherford RN [facility].  Results were reviewed/authorized by Vashti Hey, RN.  He was notified by Ernesto Rutherford RN [facility].         Prior Anticoagulation Instructions: INR 3.4 Called with results of INR obtained on pt today.  INR 3.4  Order given for pt to take coumadin 7.5mg  tonight  then take 5mg  once daily and recheck INR on 12/31/10.   Current Anticoagulation Instructions: INR 4.4 Called with results of INR obtained on pt today.  INR 4.4  Take coumadin 5mg  once daily except 2.5mg  on Tuesdays and Recheck INR wk of 01/06/11.

## 2011-01-08 NOTE — Medication Information (Signed)
Summary: [facility] D/C MEDICATION SHEET ORDER  [facility] D/C MEDICATION SHEET ORDER   Imported By: Zachary George 01/01/2011 08:47:59  _____________________________________________________________________  External Attachment:    Type:   Image     Comment:   External Document

## 2011-01-08 NOTE — Assessment & Plan Note (Signed)
Summary: 6 MO FU PER FEB REMINDER-SRS   Vital Signs:  Patient profile:   77 year old male Height:      69 inches Weight:      155 pounds Pulse rate:   73 / minute BP sitting:   123 / 79  (left arm) Cuff size:   regular  Vitals Entered By: Carlye Grippe (January 01, 2011 11:06 AM)  Visit Type:  Follow-up Primary Provider:  Dr. Ouida Sills   History of Present Illness: the patient is a 77 year old male recently discharged from the [facility] after a recurrent bout of cholangitis felt to be secondary to dysmotility of the bile duct.  He underwent ERCP and a sphincterotomy was quite large and widely patent drainage was done with duct sweep.  the patient during this episode developed E. coli bacteremia and was treated with antibiotics.  As a matter of fact he was seen a week earlier at [facility] for similar symptoms, but the patient was discharged and progressively got worse.  Clinically it does not appear that it has developed endocarditis.  On physical examination his Starr-Edwards mechanical aortic and mitral valve is crisp opening and closing clicks.  He has had no recurrent fever.  However he is on chronic steroid therapy for his rheumatoid arthritis.  After his procedure he was bridged with Lovenox but is back on Coumadin and his INR now is 4.4. His goal INR is between 3.5 and 4.5 in the setting of permanent atrial fibrillation.  The patient reports no substernal chest pain or shortness of breath.  He looks actually quite good.  He reports no palpitations presyncope or syncope.  He reports no fever or chills.  Preventive Screening-Counseling & Management  Alcohol-Tobacco     Smoking Status: quit     Year Quit: 1980  Current Medications (verified): 1)  Aspirin 81 Mg Tbec (Aspirin) .... Take One Tablet By Mouth Daily 2)  Altace 10 Mg Caps (Ramipril) .... Take 1 Tablet By Mouth Once A Day 3)  Prednisone 5 Mg Tabs (Prednisone) .... Take 1 Tablet By Mouth Once A Day 4)   Levothroid 125 Mcg Tabs (Levothyroxine Sodium) .... Take 1 Tablet By Mouth Once A Day 5)  Coumadin 5 Mg Solr (Warfarin Sodium) .... As Directed Per Coumadin Clinic 6)  Terazosin Hcl 2 Mg Caps (Terazosin Hcl) .... Take 1 Tablet By Mouth Once A Day 7)  Tramadol Hcl 50 Mg Tabs (Tramadol Hcl) .... Take 1 Tablet By Mouth Four Times A Day As Needed 8)  Lasix 20 Mg Tabs (Furosemide) .... Take 1 Tablet By Mouth Once A Day 9)  Metoprolol Tartrate 25 Mg Tabs (Metoprolol Tartrate) .... Take 1/2 Tab (12.5mg ) Two Times A Day 10)  Nitrostat 0.4 Mg Subl (Nitroglycerin) .... Dissolve One Tablet Under Tongue For Severe Chest Pain As Needed Every 5 Minutes, Not To Exceed 3 in 15 Min Time Frame  Allergies (verified): 1)  ! Penicillin 2)  Codeine  Comments:  Nurse/Medical Assistant: The patient's medication list(hospital d/c) and allergies were reviewed with the patient and were updated in the Medication and Allergy Lists.  Past History:  Past Medical History: mild LV dysfunction ejection fraction 45 to 50% rheumatic heart disease, status post St. 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  I suspect that this is due to     dysmotility of  the bile duct as evidenced by diffuse extrahepatic     dilation, sigmoid configuration.  He may have some intermittent     relative stasis of biliary flow due to the diverticulum and/or bile     duct dysmotility.  No evidence of stricture or stone.  The previous     sphincterotomy is quite large and widely patent.  Drainage     excellent with duct sweep.multiple gastric erosions.  E. coli sepsis multiple gastric erosions during the same admission  Review of Systems       The patient complains of fatigue, joint pain, and easy bruising or bleeding.  The  patient denies malaise, fever, weight gain/loss, vision loss, decreased hearing, hoarseness, chest pain, palpitations, shortness of breath, prolonged cough, wheezing, sleep apnea, coughing up blood, abdominal pain, blood in stool, nausea, vomiting, diarrhea, heartburn, incontinence, blood in urine, muscle weakness, leg swelling, rash, skin lesions, headache, fainting, dizziness, depression, anxiety, enlarged lymph nodes, and environmental allergies.    Physical Exam  Additional Exam:  General: severe rheumatoid arthritis in some distress because of pain head: Normocephalic and atraumatic eyes PERRLA/EOMI intact, conjunctiva and lids normal nose: No deformity or lesions mouth normal dentition, normal posterior pharynx neck: Supple, no JVD.  No masses, thyromegaly or abnormal cervical nodes lungs: Normal breath sounds bilaterally without wheezing.  Normal percussion heart: irregular rate and rhythm with normal closing click of S1 and S2 no S3 or S4.  PMI is normal.  No pathological murmurs abdomen: Normal bowel sounds, abdomen is soft and nontender without masses, organomegaly or hernias noted.  No hepatosplenomegaly musculoskeletal: Back normal, normal gait muscle strength and tone normal pulsus: Pulse is normal in all 4 extremities Extremities:no peripheral pitting edema neurologic: Alert and oriented x 3 skin: Intact without lesions or rashes cervical nodes: No significant adenopathy psychologic: Normal affect\par  Impression & Recommendations:  Problem # 1:  BACTEREMIA (ICD-790.7) the patient had prolonged bacteremia with E. coli due to recurrent acute cholangitis.  He has been treated with an abbreviated course of antibiotics and clinically has not developed endocarditis.  However have ordered today recurrent blood cultures to make sure that the patient does not have persistent bacteremia which could be masked by the fact that is on chronic steroid therapy and his  immunocompromise.  Problem # 2:  MITRAL VALVE DISORDERS (ICD-424.0) status post Starr-Edwards valve.  Normal physical examination His updated medication list for this problem includes:    Altace 10 Mg Caps (Ramipril) .Marland Kitchen... Take 1 tablet by mouth once a day    Lasix 20 Mg Tabs (Furosemide) .Marland Kitchen... Take 1 tablet by mouth once a day    Metoprolol Tartrate 25 Mg Tabs (Metoprolol tartrate) .Marland Kitchen... Take 1/2 tab (12.5mg ) two times a day    Nitrostat 0.4 Mg Subl (Nitroglycerin) .Marland Kitchen... Dissolve one tablet under tongue for severe chest pain as needed every 5 minutes, not to exceed 3 in 15 min time frame  Orders: T-Culture, Blood Routine ([id number])  Problem # 3:  AORTIC VALVE DISORDERS (ICD-424.1) status post Starr-Edwards aortic valve also normal physical examination His updated medication list for this problem includes:    Altace 10 Mg Caps (Ramipril) .Marland Kitchen... Take 1 tablet by mouth once a day    Lasix 20 Mg Tabs (Furosemide) .Marland Kitchen... Take 1 tablet by mouth once a day    Metoprolol Tartrate 25 Mg Tabs (Metoprolol tartrate) .Marland Kitchen... Take 1/2 tab (12.5mg ) two times a day    Nitrostat 0.4 Mg Subl (Nitroglycerin) .Marland Kitchen... Dissolve one tablet under  tongue for severe chest pain as needed every 5 minutes, not to exceed 3 in 15 min time frame  Orders: T-Culture, Blood Routine ([id number])  Problem # 4:  ATRIAL FIBRILLATION, CHRONIC, HX OF (ICD-V12.59) continue current medical therapy.  The patient is in chronic atrial fibrillation that is rate controlled.  He is essentially asymptomatic. His updated medication list for this problem includes:    Aspirin 81 Mg Tbec (Aspirin) .Marland Kitchen... Take one tablet by mouth daily    Altace 10 Mg Caps (Ramipril) .Marland Kitchen... Take 1 tablet by mouth once a day    Coumadin 5 Mg Solr (Warfarin sodium) .Marland Kitchen... As directed per coumadin clinic    Metoprolol Tartrate 25 Mg Tabs (Metoprolol tartrate) .Marland Kitchen... Take 1/2 tab (12.5mg ) two times a day    Nitrostat 0.4 Mg Subl (Nitroglycerin) .Marland Kitchen... Dissolve one  tablet under tongue for severe chest pain as needed every 5 minutes, not to exceed 3 in 15 min time frame  Problem # 5:  RHEUMATIC HEART DISEASE (ICD-398.90) Assessment: Comment Only  Patient Instructions: 1)  Labs:  blood cultures x 2 today 2)  Follow up in  3 months   Orders Added: 1)  T-Culture, Blood Routine [[id number]]

## 2011-01-08 NOTE — Letter (Signed)
Summary: [facility] D/C DR. Wende Crease  [facility] D[facility]C DR. Wende Crease   Imported By: Zachary George 01/01/2011 08:48:59  _____________________________________________________________________  External Attachment:    Type:   Image     Comment:   External Document

## 2011-01-08 NOTE — Miscellaneous (Signed)
Summary: Home Care Report/ [facility]  Home Care Report/ [facility]   Imported By: Dorise Hiss 12/30/2010 14:41:35  _____________________________________________________________________  External Attachment:    Type:   Image     Comment:   External Document

## 2011-01-10 ENCOUNTER — Encounter: Payer: Self-pay | Admitting: Cardiology

## 2011-01-10 ENCOUNTER — Telehealth: Payer: Self-pay | Admitting: Cardiology

## 2011-01-10 LAB — CONVERTED CEMR LAB: POC INR: 2.1

## 2011-01-12 NOTE — Op Note (Addendum)
NAMEMILEN, LENGACHER                ACCOUNT NO.:  [id number]  MEDICAL RECORD NO.:  [id number]          PATIENT TYPE:  INP  LOCATION:  3711                         FACILITY:  [facility]  PHYSICIAN:  Wilhemina Bonito. Marina Goodell, MD      DATE OF BIRTH:  Jul 17, 1932  DATE OF PROCEDURE:  12/17/2010 DATE OF DISCHARGE:                              OPERATIVE REPORT   PROCEDURE:  Endoscopic retrograde cholangiopancreatography with its balloon sweeps of the bile duct.  INDICATION:  Recurrent acute cholangitis.  HISTORY:  This is a 77 year old white male with multiple significant medical problems including valvular heart disease with replacement of both heart valves for which he is on chronic Coumadin, diabetes mellitus, deforming rheumatoid arthritis, and multiple prior surgeries including cholecystectomy.  Also, prior history of ERCP with sphincterotomy and bile duct stone extraction.  That procedure was complicated by bleeding.  Recently, he has been hospitalized with recurrent bouts of abdominal pain, fever, elevated liver tests, and confusion.  He has been treated with antibiotic therapy and has responded nicely with improvement in all parameters.  He is now for ERCP to evaluate symptoms.  The nature of the procedure as well as the risks, benefits, and alternatives were reviewed.  He understood and agreed to proceed.  The broad-spectrum antibiotics were continued preprocedure.  A heparin window provided.  PHYSICAL EXAMINATION:  GENERAL:  Chronically ill-appearing male, in no acute distress.  He is alert and oriented. VITAL SIGNS:  Stable. LUNGS:  Clear. HEART:  Regular. ABDOMEN:  Benign.  DESCRIPTION OF PROCEDURE:  After informed consent was obtained, the patient was sedated with 50 mcg of fentanyl and 4 mg of Versed IV.  The Pentax side-viewing scope was then passed blindly into the esophagus. The stomach revealed multiple erosions.  The duodenal bulb was normal. The postbulbar duodenum revealed no  significant abnormalities.  The minor ampulla was not sought.  The major ampulla was found to be sitting amidst a large periampullary diverticulum.  Redundant folds.  Initial injection of contrast via a wide-open major ampulla yielded a normal pancreatogram.  Subsequently, the catheter was adjusted to the biliary orifice proper.  Cholangiogram was obtained.  The bile duct was noted to be somewhat dilated and serpiginous in configuration.  However, no evidence of stricture, stones, or obstruction.  THERAPY:  A sequential balloon was inflated to 15 mm.  This was pulled through the biliary tree several times..  Only a minimal and insignificant debris extracted.  Excellent drainage throughout.  Shelf- like configuration of the distal duct due to diverticulum noted.  IMPRESSION: 1. Recurrent bouts of cholangitis.  I suspect that this is due to     dysmotility of the bile duct as evidenced by diffuse extrahepatic     dilation, sigmoid configuration.  He may have some intermittent     relative stasis of biliary flow due to the diverticulum and/or bile     duct dysmotility.  No evidence of stricture or stone.  The previous     sphincterotomy is quite large and widely patent.  Drainage     excellent with duct sweep. 2. Multiple gastric erosions.  RECOMMENDATIONS: 1. Regular diet. 2. Resume anticoagulation. 3. Finish 2-week course of antibiotics. 4. Begin PPI therapy for erosive gastritis. 5. No further GI plans at this point.     Wilhemina Bonito. Marina Goodell, MD     JNP/MEDQ  D:  12/17/2010  T:  12/18/2010  Job:  [id number]  cc:   Kingsley Callander. Ouida Sills, MD Fax: [phone]  Electronically Signed by Yancey Flemings MD on 01/12/2011 11:24:45 AM

## 2011-01-13 ENCOUNTER — Encounter (INDEPENDENT_AMBULATORY_CARE_PROVIDER_SITE_OTHER): Payer: Self-pay | Admitting: *Deleted

## 2011-01-16 NOTE — Progress Notes (Signed)
Summary: coumadin management  Phone Note Other Incoming   Caller: Ernesto Rutherford RN [facility] Reason for Call: Discuss lab or test results Summary of Call: Called with results of INR obtained on pt today.  INR 5.9  No new meds  Order given for pt to hold coumadin tonight and tomorrow night then decrease dose to 5mg  once daily except 2.5mg  on Tuesdays and Fridays.  Recheck INR 01/10/11 Initial call taken by: Vashti Hey RN,  January 07, 2011 2:24 PM     Anticoagulant Therapy  Managed by: Vashti Hey, RN Referring MD: Andee Lineman PCP: Dr. Scharlene Corn MD: Andee Lineman MD, Michelle Piper Indication 1: Aortic Valve Replacement (ICD-V43.3) Indication 2: Atrial Fibrillation (ICD-427.31) Lab Used: [facility]  Site: [place] INR POC 5.9  Dietary changes: no    Health status changes: no    Bleeding/hemorrhagic complications: no    Recent/future hospitalizations: no    Any changes in medication regimen? no    Recent/future dental: no  Any missed doses?: no       Is patient compliant with meds? yes         Anticoagulation Management History:      His anticoagulation is being managed by telephone today.  Positive risk factors for bleeding include an age of 30 years or older and history of CVA/TIA.  The bleeding index is 'intermediate risk'.  Positive CHADS2 values include History of CHF, Age > 81 years old, and Prior Stroke/CVA/TIA.  The start date was 01/22/1994.  Anticoagulation responsible provider: Andee Lineman MD, Michelle Piper.  INR POC: 5.9.  Exp: 10/11.    Anticoagulation Management Assessment/Plan:      The patient's current anticoagulation dose is Coumadin 5 mg solr: as directed per Coumadin clinic.  The target INR is 3.5 - 4.5.  The next INR is due 01/10/2011.  Anticoagulation instructions were given to Ernesto Rutherford RN [facility].  Results were reviewed/authorized by Vashti Hey, RN.  He was notified by Ernesto Rutherford RN [facility].         Prior Anticoagulation Instructions: INR 4.4 Called with results of  INR obtained on pt today.  INR 4.4  Take coumadin 5mg  once daily except 2.5mg  on Tuesdays and Recheck INR wk of 01/06/11.  Current Anticoagulation Instructions: INR 5.9 Called with results of INR obtained on pt today.  INR 5.9  No new meds  Order given for pt to hold coumadin tonight and tomorrow night then decrease dose to 5mg  once daily except 2.5mg  on Tuesdays and Fridays.  Recheck INR 01/10/11

## 2011-01-16 NOTE — Miscellaneous (Signed)
Summary: Home Care Report/ [facility]  Home Care Report/ [facility]   Imported By: Dorise Hiss 01/09/2011 10:40:24  _____________________________________________________________________  External Attachment:    Type:   Image     Comment:   External Document

## 2011-01-16 NOTE — Progress Notes (Signed)
Summary: coumadin management  Phone Note Other Incoming   Caller: Ernesto Rutherford RN [facility] Reason for Call: Discuss lab or test results Summary of Call: Called with results of INR obtained on pt today.  INR 2.1  Order given for pt to      Anticoagulant Therapy  Managed by: Vashti Hey, RN Referring MD: Andee Lineman PCP: Dr. Scharlene Corn MD: Diona Browner MD, Remi Deter Indication 1: Aortic Valve Replacement (ICD-V43.3) Indication 2: Atrial Fibrillation (ICD-427.31) Lab Used: [facility] [place] Site: [place] INR POC 2.1  Dietary changes: no    Health status changes: no    Bleeding/hemorrhagic complications: no    Recent/future hospitalizations: no    Any changes in medication regimen? no    Recent/future dental: no  Any missed doses?: no       Is patient compliant with meds? yes         Anticoagulation Management History:      His anticoagulation is being managed by telephone today.  Positive risk factors for bleeding include an age of 75 years or older and history of CVA/TIA.  The bleeding index is 'intermediate risk'.  Positive CHADS2 values include History of CHF, Age > 68 years old, and Prior Stroke/CVA/TIA.  The start date was 01/22/1994.  Anticoagulation responsible provider: Diona Browner MD, Remi Deter.  INR POC: 2.1.  Exp: 10/11.    Anticoagulation Management Assessment/Plan:      The patient's current anticoagulation dose is Coumadin 5 mg solr: as directed per Coumadin clinic.  The target INR is 3.5 - 4.5.  The next INR is due 01/17/2011.  Anticoagulation instructions were given to patient.  Results were reviewed/authorized by Vashti Hey, RN.  He was notified by Vashti Hey RN.         Prior Anticoagulation Instructions: INR 5.9 Called with results of INR obtained on pt today.  INR 5.9  No new meds  Order given for pt to hold coumadin tonight and tomorrow night then decrease dose to 5mg  once daily except 2.5mg  on Tuesdays and Fridays.  Recheck INR 01/10/11  Current  Anticoagulation Instructions: INR 2.1 Take coumadin 7.5mg  tonight then increase dose to 5mg  once daily

## 2011-01-17 ENCOUNTER — Encounter: Payer: Self-pay | Admitting: Cardiology

## 2011-01-17 ENCOUNTER — Encounter (INDEPENDENT_AMBULATORY_CARE_PROVIDER_SITE_OTHER): Payer: Medicare Other

## 2011-01-17 DIAGNOSIS — I4891 Unspecified atrial fibrillation: Secondary | ICD-10-CM

## 2011-01-17 DIAGNOSIS — I6789 Other cerebrovascular disease: Secondary | ICD-10-CM

## 2011-01-17 DIAGNOSIS — Z7901 Long term (current) use of anticoagulants: Secondary | ICD-10-CM

## 2011-01-17 LAB — CONVERTED CEMR LAB: POC INR: 5.3

## 2011-01-22 NOTE — Medication Information (Signed)
Summary: ccr-lr  Anticoagulant Therapy  Managed by: Vashti Hey, RN Referring MD: Andee Lineman PCP: Dr. Scharlene Corn MD: Myrtis Ser MD, Tinnie Gens Indication 1: Aortic Valve Replacement (ICD-V43.3) Indication 2: Atrial Fibrillation (ICD-427.31) Lab Used: [facility] [place] Site: [place] INR POC 5.3  Dietary changes: no    Health status changes: no    Bleeding/hemorrhagic complications: no    Recent/future hospitalizations: no    Any changes in medication regimen? no    Recent/future dental: no  Any missed doses?: no       Is patient compliant with meds? yes       Allergies: 1)  ! Penicillin 2)  Codeine  Anticoagulation Management History:      The patient is taking warfarin and comes in today for a routine follow up visit.  Positive risk factors for bleeding include an age of 75 years or older and history of CVA/TIA.  The bleeding index is 'intermediate risk'.  Positive CHADS2 values include History of CHF, Age > 62 years old, and Prior Stroke/CVA/TIA.  The start date was 01/22/1994.  Anticoagulation responsible provider: Myrtis Ser MD, Tinnie Gens.  INR POC: 5.3.  Exp: 10/11.    Anticoagulation Management Assessment/Plan:      The patient's current anticoagulation dose is Coumadin 5 mg solr: as directed per Coumadin clinic.  The target INR is 3.5 - 4.5.  The next INR is due 01/28/2011.  Anticoagulation instructions were given to patient.  Results were reviewed/authorized by Vashti Hey, RN.  He was notified by Vashti Hey RN.         Prior Anticoagulation Instructions: INR 2.1 Take coumadin 7.5mg  tonight then increase dose to 5mg  once daily   Current Anticoagulation Instructions: INR 5.3 Hold coumadin tonight then decrease dose to 5mg  once daily except 2.5mg  on Tuesdays

## 2011-01-22 NOTE — Miscellaneous (Signed)
Summary: Home Care Report/ [facility]  Home Care Report/ [facility]   Imported By: Dorise Hiss 01/14/2011 12:04:32  _____________________________________________________________________  External Attachment:    Type:   Image     Comment:   External Document

## 2011-01-22 NOTE — Letter (Signed)
Summary: Engineer, materials at [facility]  [address]   [place], [place] [address]   Phone: [phone]  Fax: [phone]        January 13, 2011 MRN: [id number]   [facility] [address] [place], [place]  [address]   Dear Mr. Hollinshead,  Your test ordered by Selena Batten has been reviewed by your physician (or physician assistant) and was found to be normal or stable. Your physician (or physician assistant) felt no changes were needed at this time.  ____ Echocardiogram  ____ Cardiac Stress Test  __X__ Lab Work - blood cultures negative  ____ Peripheral vascular study of arms, legs or neck  ____ CT scan or X-ray  ____ Lung or Breathing test  ____ Other:   Thank you.   Hoover Brunette, LPN    Duane Boston, M.D., F.A.C.C. Thressa Sheller, M.D., F.A.C.C. Oneal Grout, M.D., F.A.C.C. Cheree Ditto, M.D., F.A.C.C. Daiva Nakayama, M.D., F.A.C.C. Kenney Houseman, M.D., F.A.C.C. Jeanne Ivan, PA-C

## 2011-01-28 ENCOUNTER — Encounter (INDEPENDENT_AMBULATORY_CARE_PROVIDER_SITE_OTHER): Payer: Medicare Other

## 2011-01-28 ENCOUNTER — Encounter: Payer: Self-pay | Admitting: Cardiology

## 2011-01-28 DIAGNOSIS — Z954 Presence of other heart-valve replacement: Secondary | ICD-10-CM

## 2011-01-28 DIAGNOSIS — I4891 Unspecified atrial fibrillation: Secondary | ICD-10-CM

## 2011-02-06 NOTE — Medication Information (Signed)
Summary: CCR-LR  Anticoagulant Therapy  Managed by: Vashti Hey, RN Referring MD: Andee Lineman PCP: Dr. Scharlene Corn MD: Diona Browner MD, Remi Deter Indication 1: Aortic Valve Replacement (ICD-V43.3) Indication 2: Atrial Fibrillation (ICD-427.31) Lab Used: [facility] Site: [place] INR POC 3.6  Dietary changes: no    Health status changes: no    Bleeding/hemorrhagic complications: no    Recent/future hospitalizations: no    Any changes in medication regimen? no    Recent/future dental: no  Any missed doses?: no       Is patient compliant with meds? yes       Allergies: 1)  ! Penicillin 2)  Codeine  Anticoagulation Management History:      The patient is taking warfarin and comes in today for a routine follow up visit.  Positive risk factors for bleeding include an age of 75 years or older and history of CVA/TIA.  The bleeding index is 'intermediate risk'.  Positive CHADS2 values include History of CHF, Age > 28 years old, and Prior Stroke/CVA/TIA.  The start date was 01/22/1994.  Anticoagulation responsible provider: Diona Browner MD, Remi Deter.  INR POC: 3.6.  Cuvette Lot#: [id number].  Exp: 10/11.    Anticoagulation Management Assessment/Plan:      The patient's current anticoagulation dose is Coumadin 5 mg solr: as directed per Coumadin clinic.  The target INR is 3.5 - 4.5.  The next INR is due 02/11/2011.  Anticoagulation instructions were given to patient.  Results were reviewed/authorized by Vashti Hey, RN.  He was notified by Vashti Hey RN.         Prior Anticoagulation Instructions: INR 5.3 Hold coumadin tonight then decrease dose to 5mg  once daily except 2.5mg  on Tuesdays  Current Anticoagulation Instructions: INR 3.6 Continue coumadin 5mg  once daily except 2.5mg  on Tuesdays

## 2011-02-11 ENCOUNTER — Encounter: Payer: Self-pay | Admitting: Cardiology

## 2011-02-11 ENCOUNTER — Encounter (INDEPENDENT_AMBULATORY_CARE_PROVIDER_SITE_OTHER): Payer: Medicare Other

## 2011-02-11 DIAGNOSIS — Z7901 Long term (current) use of anticoagulants: Secondary | ICD-10-CM

## 2011-02-11 DIAGNOSIS — I4891 Unspecified atrial fibrillation: Secondary | ICD-10-CM

## 2011-02-11 LAB — CONVERTED CEMR LAB: POC INR: 4.2

## 2011-02-18 NOTE — Medication Information (Signed)
Summary: ccr-lr  Anticoagulant Therapy  Managed by: Vashti Hey, RN Referring MD: Andee Lineman PCP: Dr. Scharlene Corn MD: Antoine Poche MD, Fayrene Fearing Indication 1: Aortic Valve Replacement (ICD-V43.3) Indication 2: Atrial Fibrillation (ICD-427.31) Lab Used: [facility] Site: [place] INR POC 4.2  Dietary changes: no    Health status changes: no    Bleeding/hemorrhagic complications: no    Recent/future hospitalizations: no    Any changes in medication regimen? no    Recent/future dental: no  Any missed doses?: no       Is patient compliant with meds? yes       Allergies: 1)  ! Penicillin 2)  Codeine  Anticoagulation Management History:      The patient is taking warfarin and comes in today for a routine follow up visit.  Positive risk factors for bleeding include an age of 75 years or older and history of CVA/TIA.  The bleeding index is 'intermediate risk'.  Positive CHADS2 values include History of CHF, Age > 79 years old, and Prior Stroke/CVA/TIA.  The start date was 01/22/1994.  Anticoagulation responsible provider: Antoine Poche MD, Fayrene Fearing.  INR POC: 4.2.  Cuvette Lot#: [id number].  Exp: 10/11.    Anticoagulation Management Assessment/Plan:      The patient's current anticoagulation dose is Coumadin 5 mg solr: as directed per Coumadin clinic.  The target INR is 3.5 - 4.5.  The next INR is due 02/28/2011.  Anticoagulation instructions were given to patient.  Results were reviewed/authorized by Vashti Hey, RN.  He was notified by Vashti Hey RN.         Prior Anticoagulation Instructions: INR 3.6 Continue coumadin 5mg  once daily except 2.5mg  on Tuesdays   Current Anticoagulation Instructions: INR 4.2 Continue coumadin 5mg  once daily except 2.5mg  on Tuesdays Increase greens

## 2011-02-22 ENCOUNTER — Encounter: Payer: Self-pay | Admitting: Cardiology

## 2011-02-22 DIAGNOSIS — Z8679 Personal history of other diseases of the circulatory system: Secondary | ICD-10-CM

## 2011-02-22 DIAGNOSIS — Z7901 Long term (current) use of anticoagulants: Secondary | ICD-10-CM

## 2011-02-22 DIAGNOSIS — I6350 Cerebral infarction due to unspecified occlusion or stenosis of unspecified cerebral artery: Secondary | ICD-10-CM

## 2011-02-22 DIAGNOSIS — Z954 Presence of other heart-valve replacement: Secondary | ICD-10-CM

## 2011-02-22 DIAGNOSIS — I059 Rheumatic mitral valve disease, unspecified: Secondary | ICD-10-CM

## 2011-02-22 DIAGNOSIS — I359 Nonrheumatic aortic valve disorder, unspecified: Secondary | ICD-10-CM

## 2011-02-22 DIAGNOSIS — Z9889 Other specified postprocedural states: Secondary | ICD-10-CM

## 2011-02-28 ENCOUNTER — Ambulatory Visit (INDEPENDENT_AMBULATORY_CARE_PROVIDER_SITE_OTHER): Payer: Medicare Other | Admitting: *Deleted

## 2011-02-28 DIAGNOSIS — I059 Rheumatic mitral valve disease, unspecified: Secondary | ICD-10-CM

## 2011-02-28 DIAGNOSIS — Z954 Presence of other heart-valve replacement: Secondary | ICD-10-CM

## 2011-02-28 DIAGNOSIS — I359 Nonrheumatic aortic valve disorder, unspecified: Secondary | ICD-10-CM

## 2011-02-28 DIAGNOSIS — Z7901 Long term (current) use of anticoagulants: Secondary | ICD-10-CM

## 2011-02-28 DIAGNOSIS — I6350 Cerebral infarction due to unspecified occlusion or stenosis of unspecified cerebral artery: Secondary | ICD-10-CM

## 2011-02-28 DIAGNOSIS — Z8679 Personal history of other diseases of the circulatory system: Secondary | ICD-10-CM

## 2011-02-28 DIAGNOSIS — Z9889 Other specified postprocedural states: Secondary | ICD-10-CM

## 2011-02-28 LAB — POCT INR: INR: 4.8

## 2011-03-14 ENCOUNTER — Other Ambulatory Visit: Payer: Self-pay | Admitting: Cardiology

## 2011-03-25 ENCOUNTER — Ambulatory Visit (INDEPENDENT_AMBULATORY_CARE_PROVIDER_SITE_OTHER): Payer: Medicare Other | Admitting: *Deleted

## 2011-03-25 DIAGNOSIS — Z7901 Long term (current) use of anticoagulants: Secondary | ICD-10-CM

## 2011-03-25 DIAGNOSIS — Z8679 Personal history of other diseases of the circulatory system: Secondary | ICD-10-CM

## 2011-03-25 DIAGNOSIS — Z9889 Other specified postprocedural states: Secondary | ICD-10-CM

## 2011-03-25 DIAGNOSIS — I359 Nonrheumatic aortic valve disorder, unspecified: Secondary | ICD-10-CM

## 2011-03-25 DIAGNOSIS — I6350 Cerebral infarction due to unspecified occlusion or stenosis of unspecified cerebral artery: Secondary | ICD-10-CM

## 2011-03-25 DIAGNOSIS — I059 Rheumatic mitral valve disease, unspecified: Secondary | ICD-10-CM

## 2011-03-25 DIAGNOSIS — Z954 Presence of other heart-valve replacement: Secondary | ICD-10-CM

## 2011-03-25 LAB — POCT INR: INR: 3.6

## 2011-04-11 ENCOUNTER — Ambulatory Visit (INDEPENDENT_AMBULATORY_CARE_PROVIDER_SITE_OTHER): Payer: Medicare Other | Admitting: *Deleted

## 2011-04-11 DIAGNOSIS — Z9889 Other specified postprocedural states: Secondary | ICD-10-CM

## 2011-04-11 DIAGNOSIS — I6350 Cerebral infarction due to unspecified occlusion or stenosis of unspecified cerebral artery: Secondary | ICD-10-CM

## 2011-04-11 DIAGNOSIS — I359 Nonrheumatic aortic valve disorder, unspecified: Secondary | ICD-10-CM

## 2011-04-11 DIAGNOSIS — Z8679 Personal history of other diseases of the circulatory system: Secondary | ICD-10-CM

## 2011-04-11 DIAGNOSIS — Z7901 Long term (current) use of anticoagulants: Secondary | ICD-10-CM

## 2011-04-11 DIAGNOSIS — Z954 Presence of other heart-valve replacement: Secondary | ICD-10-CM

## 2011-04-11 DIAGNOSIS — I059 Rheumatic mitral valve disease, unspecified: Secondary | ICD-10-CM

## 2011-04-15 NOTE — Assessment & Plan Note (Signed)
[facility]                          [facility] NOTE   NAME:[name], [name]                       MRN:          [id number]  DATE:03/13/2008                            DOB:          August 13, 1932    HISTORY OF PRESENT ILLNESS:  The patient is a 77 year old male with  severe rheumatoid arthritis, status post aortic valve and mitral valve  replacement (St. Jude and Starr-Edwards valves, respectively).  The  patient complains of persistent weakness.  He has had nausea for one  month.  This actually antedates the use of hydrocodone that I gave him  on the last office visit for arthritic complaints related to his RA.  He  also complains of sick feeling and generalized weakness which appears to  be progressively worse on EKG today.  It appears to be an accelerated  junctional rhythm, and his wife is rightfully concerned about the  possibility of digoxin toxicity.  He also had significant ST-T wave  changes that could go along with digoxin toxicity.  He denies, however,  any substernal chest pain.  He has no shortness of breath.   MEDICATIONS:  1. Aspirin.  2. Altace.  3. Prednisone.  4. Terazosin.  5. Levothyroxine.  6. Lanoxin 250 mcg.  7. Coumadin as directed.  8. Multivitamin.  9. Tramadol for RA.  10.Furosemide.  11.Glucosamine.   PHYSICAL EXAMINATION:  VITAL SIGNS:  Blood pressure 135/77, heart rate  78.  Weight 136.  NECK EXAM:  Normal carotid upstroke.  No carotid bruits.  LUNGS:  Clear breath sounds bilaterally.  HEART:  Regular rate and rhythm with normal closing click of S1 and S2  secondary to 2 mechanical valves.  There are no pathological murmurs.  ABDOMEN:  Soft.  EXTREMITY EXAM:  Reveals no cyanosis, clubbing, or edema.  NEUROLOGICAL:  Patient alert, oriented, grossly nonfocal.   PROBLEM LIST:  1. Persistent weakness and nausea.  2. Mild left ventricular dysfunction (ejection 40-50%).  3. Wall motion abnormalities by  echocardiogram and areas of scar      infarct on recent Cardiolite.  4. History of rheumatic heart disease and status post St. Jude aortic      valve and mitral Starr-Edwards valve replacement.  5. Permanent atrial fibrillation, now in accelerated junctional      rhythm.  6. Small abdominal aortic aneurysm.  7. Treated hypothyroidism.  8. Hypertension, controlled.   PLAN:  1. The patient's persistent weakness and nausea could be related to      digoxin toxicity and will check a digoxin level.  I held his      digoxin in the meanwhile.  2. The patient's accelerated junctional rhythm could also be      indicative of tachy-brady syndrome which could be contributing to      his symptoms, and we will order a CardioNet monitor.  3. At the present time, I have opted not to proceed with an ischemia      workup as the patient's Cardiolite study shows 2 scars but no      definite ischemia.  Symptoms are also not  consistent with worsening      ischemic heart disease, although this may require an evaluation at      a later date.  4. Laboratory work has been ordered including a CBC, CMET, TSH, and      digoxin level.     Learta Codding, MD,FACC  Electronically Signed    GED/MedQ  DD: 03/13/2008  DT: 03/13/2008  Job #: [id number]

## 2011-04-15 NOTE — Assessment & Plan Note (Signed)
[facility]                          [facility] NOTE   NAME:Batdorf, MADDAX PALINKAS                       MRN:          [id number]  DATE:06/01/2008                            DOB:          29-Sep-1932    HISTORY OF PRESENT ILLNESS:  The patient is a 77 year old male with a  history of severe rheumatoid arthritis, status post aortic valve and  mitral valve replacement.  The patient is status post CVA secondary to  likely valvular emboli as well as possibly mesenteric ischemia secondary  to embolic events.  The patient is not doing well on the higher dose of  Coumadin.  His goal of INR is between 3.5-4.5 in conjunction with  aspirin.  He denies any chest pain or shortness of breath.  His appetite  has remarkably improved.  He says he is feeling much better since his  last office visit and last hospitalization.   MEDICATIONS:  1. Aspirin 81 mg p.o. daily.  2. Altace 10 mg p.o. daily.  3. Prednisone 5 mg p.o. daily.  4. Thorazine 2 mg p.o. daily.  5. Levothyroxine 125 mcg p.o. daily.  6. Coumadin as directed.  7. Multivitamin.  8. Furosemide 20 mg p.r.n.  9. Glucosamine.  10.Phenergan.   PHYSICAL EXAMINATION:  VITAL SIGNS:  Blood pressure is 115/65, heart  rate 84, and weight 133 pounds.  NECK:  Normal carotid upstroke without carotid bruits.  LUNGS:  Clear breath sounds bilaterally.  HEART:  Irregular rate and rhythm with normal closing click of S1 and  closing click of S2.  ABDOMEN:  Soft.  EXTREMITIES:  No cyanosis, clubbing, or edema.  NEUROLOGIC:  Cranial nerves are grossly intact.   PROBLEMS:  1. Mild LV dysfunction, ejection fraction 45-50%.  2. Rheumatic heart disease, status post St. Jude aortic valve and      mitral valve replacement.  3. Valvular emboli with CVA and vessel ischemia (resolved) on higher      dosing of Coumadin.  4. Atrial fibrillation.  5. Small abdominal aortic aneurysm.  6. Hypertension.   PLAN:  1.  Patient is doing remarkably well, make no changes in his      medication.  2. The patient from now will be monitored q. 2 weekly with Coumadin      given need to keep his      INR above 3.5 to avoid excess risk of bleeding.  3. The patient is to follow up with [place] in 6 months.     Learta Codding, MD,FACC  Electronically Signed    GED/MedQ  DD: 06/01/2008  DT: 06/02/2008  Job #: [id number]   cc:   Kingsley Callander. Ouida Sills, MD

## 2011-04-15 NOTE — Assessment & Plan Note (Signed)
[facility]                          [facility] NOTE   NAME:[name], [name]                       MRN:          [id number]  DATE:07/20/2007                            DOB:          [birth year]    REFERRING PHYSICIAN:  Kingsley Callander. Ouida Sills, MD   HISTORY OF PRESENT ILLNESS:  The patient is a 77 year old white married  male former patient of Dr. Corinda Gubler.  The patient has a history of  rheumatic heart disease and is status post aortic and mitral valve  replacement.  The patient initially underwent a mitral commissurotomy  prior to valve replacement.  The patient has Star-Edwards valve in the  mitral position and a St. Jude in the aortic position.  Had an  echocardiographic study done last year and reviewed by Dr. Dietrich Pates,  which showed normal valve function.  The patient has a history of  chronic atrial fibrillation and is on chronic Coumadin therapy.  His INR  is kept between 3 and 4.  From a cardiovascular standpoint, the patient  is actually doing quite well.  He has no chest pain, palpitations,  shortness of breath, or syncope.  His predominant morbidity is his  rheumatoid arthritis, which is quite disabling, associated with  significant pain.  The patient asked me today if he could safely take  Celebrex.  I discouraged him from using this, particularly in light of  his Coumadin use with high INR levels for his Star-Edwards valve.  I  told him that Celebrex would interfere with his Coumadin and discouraged  him from using this.  As listed below, I have actually given the patient  a prescription for Vicodin p.r.n. severe pain.  He already uses Ultram,  which can be used intermittently safely, even in conjunction with  Coumadin.   INR today is 4.2.   MEDICATIONS:  1. Aspirin 81 mg daily.  2. Altace 10 mg p.o. daily.  3. Prednisone 5 mg p.o. daily.  4. Terazosin 2 mg p.o. daily.  5. Levothyroxine 125 __________ p.o. daily.  6. __________ 250  mcg p.o. daily.  7. Coumadin as directed.  8. Multivitamin.  9. Tramadol.  10.Furosemide 20 mg p.o. p.r.n.  11.Glucosamine.   PHYSICAL EXAMINATION:  VITAL SIGNS:  Blood pressure is 135/69, heart  rate 76 beats per minute, weight 139 pounds.  NECK:  Normal carotid upstroke.  No carotid bruits.  LUNGS:  Clear breath sounds bilaterally.  HEART:  Regular rate and rhythm.  Normal closing click of S1 and S2, but  no definite pathological murmurs.  ABDOMEN:  Soft and nontender with no rebound or guarding.  Good bowel  sounds.  EXTREMITIES:  No cyanosis, clubbing, or edema.  NEURO:  The patient is alert and oriented, grossly nonfocal.  Significant joint deformities secondary to rheumatoid arthritis.   PROBLEM LIST:  1. Rheumatoid arthritis.  2. Rheumatic heart disease.      a.     Status post St. Jude and Star-Edwards aortic and mitral       valve replacement respectively.      b.     Normal  left ventricular function.  3. Chronic atrial fibrillation.  4. Coumadin anticoagulation.  5. Hypertension, controlled.  6. Small aortic aneurysm.   PLAN:  1. The patient doing well from a cardiovascular perspective and I made      no change in his medical regimen.  His INR level is 4.2.  I have      made no changes today.  I will ideally keep him between 3 and 4.  2. I have given the patient a prescription for Vicodin given the fact      that he has got significant pain and I do not want him to use      Celebrex for the time-being.  3. The patient can follow up at 6 months, at which time he can have a      repeat echocardiographic study.     Learta Codding, MD,FACC  Electronically Signed    GED/MedQ  DD: 07/20/2007  DT: 07/21/2007  Job #: [id number]   cc:   Kingsley Callander. Ouida Sills, MD

## 2011-04-15 NOTE — Assessment & Plan Note (Signed)
[facility]                          [facility] NOTE   NAME:[name], [name]                       MRN:          [id number]  DATE:02/08/2008                            DOB:          1932/04/21    PRIMARY CARDIOLOGIST:  Dr. Lewayne Bunting.   REASON FOR VISIT:  Scheduled clinic follow-up.   [name] returns to the clinic after last seen here in August 2008, by  Dr. Andee Lineman.  At that time, he established with Dr. Andee Lineman after a  longstanding relationship with Dr. Sarajane Jews, who has since retired.   Mr. Fahl' cardiac history is notable for rheumatic heart disease  status post aortic and mitral valve replacement in 1990, in [place],  [place].  He was treated with a St. Jude aortic valve prosthesis and a  Starr-Edwards prosthetic mitral valve.  Prior to this, he was initially  treated with mitral valve commissurotomy, by Dr. Arnette Felts, at [facility] in  1975.   The patient also has permanent atrial fibrillation and is on chronic  Coumadin, followed in our clinic.  He has no known history of coronary  artery disease.   The patient also has history of a small abdominal aortic aneurysm, with  his last study in November 2007 (2.7 x 2.7 cm).   A recent 2-D echo, ordered by Dr. Andee Lineman, indicated mild LVD (45 - 50% )  with numerous wall motion abnormalities; but with respect to the  mechanical valves, these were both functioning normally.  There was also  moderate pulmonary hypertension (45 - 50 mmHg), with normal right  ventricular function.   Clinically, [name] complaints of feeling persistently weak, but  denies any specific chest pain.  Of note, however, he has significantly  limited mobility, secondary to severe arthritis and rheumatoid  arthritis.  He has been recently treated with Z-Pak for a productive  cough and possible bronchitis.  He states that he initially felt a  little better, but continues to cough.  He is also concerned  about  weight loss and, in fact, has lost 3 pounds since his last clinic visit  with [place].   The patient denies any tachy palpitations or periodic dizziness or  presyncope.  He also denies any overt evidence of bleeding.   The patient has not smoked tobacco since 1980.   Electrocardiogram today reveals atrial fibrillation at 78 beats per  minute with chronic inferolateral ST-segment depression.   CURRENT MEDICATIONS:  Aspirin 81 daily. Coumadin as directed. Altace.  Prednisone. Terazosin. Levothroid 0.125. Lanoxin 0.25 daily.  Furosemide  20 p.r.n. and glucosamine t.i.d.   PHYSICAL EXAMINATION:  Blood pressure 122/68, pulse 77, regular weight  136.8 (down 3).  General 77 year old male sitting upright in no distress.  HEENT:  Normocephalic, atraumatic.  NECK:  Palpable bilateral pulse without bruits; no JVD at 90 degrees.  LUNGS:  Clear to auscultation all fields.  HEART:  Regular rhythm (S1, S2) audible clicks during both S1-S2; short,  high-pitched systolic ejection murmur heard loudest in the left pre-  axillary line.  No diastolic blow.  No rubs.  ABDOMEN:  Soft, nontender, intact bowel sounds.  Nonpalpable aorta in  the epigastrium, with no associated bruit.  EXTREMITIES:  Minimally palpable posterior tibialis pulses with 1+,  bilateral pedal edema.  NEURO:  Flat affect, but no focal deficit.   IMPRESSION:  1. persistent weakness.  2. Mild left ventricular dysfunction.      a.     EF 45 - 50% with multiple wall motion abnormalities, by       recent a 2-D echo.      b.     No documented history of coronary artery disease.  3. Rheumatic heart disease.      a.     Status post bi valvular replacement in 1990 ([place]):       St. Jude AVR/Star-Edwards MVR.      b.     Status post mitral valve commissurotomy 1975 ([facility]).  4. Permanent atrial fibrillation, on chronic Coumadin.  5. History of small abdominal aortic aneurysm.  6. Treated hypothyroidism.  7. Severe rheumatoid  arthritis.  8. Hypertension, well-controlled.   PLAN:  1. Adenosine stress Cardiolite for risk stratification, in light of      recent mild LVD with numerous wall motion abnormalities, by 2-D      echocardiography.  Although the patient specifically denies any      frank chest discomfort, he is reporting significant weakness and I      am concerned that he may have occult significant coronary artery      disease.  2. Surveillance ultrasound of the abdomen, given his history of a      small aortic aneurysm in 2007.  3. Request recent blood work from Dr. Alonza Smoker office, with particular      emphasis on a TSH, renal function, and lipid status.  4. Check a Pro Time today, with continued management by our Coumadin      Clinic staff.  5. Schedule early clinic follow-up with myself and Dr. Andee Lineman in 1      month for review of study results and further recommendations.      Gene Serpe, PA-C  Electronically Signed      Learta Codding, MD,FACC  Electronically Signed   GS/MedQ  DD: 02/08/2008  DT: 02/09/2008  Job #: [id number]   cc:   Kingsley Callander. Ouida Sills, MD

## 2011-04-15 NOTE — Assessment & Plan Note (Signed)
[facility]                          [facility]   NAME:[name], [name]                       MRN:          [id number]  DATE:04/26/2009                            DOB:          1932-04-04    REFERRING PHYSICIAN:  Kingsley Callander. Ouida Sills, MD   HISTORY OF PRESENT ILLNESS:  The patient is a 77 year old male with a  history of severe rheumatoid arthritis, status post aortic and mitral  valve replacement.  The patient also had a prior history of CVA  secondary to possible valvular emboli.  The patient's main complaint is  arthritic pain, which is quite severe.  He also has a generalized  anxiety disorder, which has much improved with citalopram.  The patient  presents for routine followup visit.  He remains in atrial fibrillation.  He denies any shortness of breath or chest pain.  His INR was 3.5 today  in the office.   MEDICATIONS:  1. OsteoSheath 4 b.i.d.  2. Aspirin 81 mg a day.  3. Altace 10 mg p.o. daily.  4. Prednisone 5 mg p.o. daily.  5. Levothyroxine 125 mcg p.o. daily.  6. Coumadin as directed.  7. Multivitamin.  8. Tramadol as needed.  9. Glucosamine b.i.d.  10.Clonazepam 0.5 mg half-tablet p.o. daily.  11.Citalopram 20 mg half-tablet p.o. daily.  12.Terazosin 5 mg p.o. daily.   PHYSICAL EXAMINATION:  VITAL SIGNS:  Blood pressure 114/60, heart rate  is 58, and weight is 127 pounds.  NECK:  No carotid upstroke.  No carotid bruits.  LUNGS:  Clear breath sounds bilaterally.  HEART:  Irregular rate and rhythm.  Normal closing click of S1 and S2.  No pathological murmurs.  ABDOMEN:  Soft, nontender.  No rebound or guarding.  Good bowel sounds.  EXTREMITIES:  No cyanosis, clubbing, or edema.  NEUROLOGIC:  The patient is alert and oriented.  Grossly nonfocal.   PROBLEM LIST:  1. Generalized anxiety disorder, stable.  2. Fibrillation, chronic.  3. Aortic valve replacement with normal function prosthesis.  4. Mitral valve replacement  with normal function prosthesis.   PLAN:  1. The patient does have a skin lesion on the left cheek.  I asked him      to go to see Dr. Emily Filbert to make sure that there is no malignancy.  2. Continue current medical therapy and adjustments of Coumadin as      needed.  3. The patient has no heart failure symptoms.  I will not change any      of his medications, otherwise.     Learta Codding, MD,FACC  Electronically Signed    GED/MedQ  DD: 04/26/2009  DT: 04/27/2009  Job #: [id number]   cc:   Kingsley Callander. Ouida Sills, MD

## 2011-04-18 NOTE — Letter (Signed)
September 22, 2006     RE:  CLIFFTON, SPRADLEY  MRN:  [id number]  /  DOB:  August 08, 1932   ADDENDUM:  We plan to get an abdominal ultrasound as well.    Sincerely,     ______________________________  E. Graceann Congress, MD, [facility]    EJL/MedQ  DD: 09/22/2006  DT: 09/23/2006  Job #: [id number]

## 2011-04-18 NOTE — Procedures (Signed)
NAME:  [name], [name]                          ACCOUNT NO.:  [id number]   MEDICAL RECORD NO.:  [id number]                   PATIENT TYPE:  OUT   LOCATION:  RAD                                  FACILITY:  [facility]   PHYSICIAN:  Vida Roller, M.D.                DATE OF BIRTH:  Nov 09, 1932   DATE OF PROCEDURE:  06/17/2004  DATE OF DISCHARGE:                                  ECHOCARDIOGRAM   TAPE NUMBER:  [id number].   TAPE COUNT:  [id number].   This is a 77 year old man who has severe valvular heart disease status post  both an aortic as well as mitral valve replacement, also status post bypass  surgery.  There is no documentation of the valves, but I believe the aortic  valve is a St. Jude prosthesis and the mitral valve appears to be a [employer] and cage valve.  Previous echocardiogram from September 2004  showed severe left atrial enlargement, stable valve structure and function,  normal left ventricular systolic function with an ejection fraction of 50-  55%, mild tricuspid regurgitation with mildly elevated right ventricular  pressures essentially unchanged from the previous echocardiogram of  September 2002.  Today's echocardiogram is of poor technical quality.  The  overall MO tracings are difficult to interpret.   The left ventricle appears to be mildly enlarged with evidence of depressed  left ventricular systolic function.  Estimated ejection fraction is 40-45%.  The right ventricle appears to be normal size with normal systolic function.   Both atria are markedly enlarged, left greater than right.  There is no  obvious atrial septal defect.   The aortic valve appears to be mechanical and appears to be a St. Jude  prosthesis.  It is well seated.  There is no evidence of stenosis.  The peak  gradient across the valve is 24 mmHg.  There was no obvious evidence of  perivalvular leak.   The Starr-Edwards valve has a mean gradient of 5 mmHg.  It is well seated.  It does  not appear to have any significant regurgitation.   The tricuspid valve has moderate regurgitation.  No stenosis is seen.   Pulmonic valve is not well seen.   There is no obvious pericardial effusion.      ___________________________________________                                            Vida Roller, M.D.   JH/MEDQ  D:  06/17/2004  T:  06/17/2004  Job:  [id number]

## 2011-04-18 NOTE — Procedures (Signed)
   NAME:  [name], [name]                          ACCOUNT NO.:  [id number]   MEDICAL RECORD NO.:  [id number]                   PATIENT TYPE:  OUT   LOCATION:  RAD                                  FACILITY:  [facility]   PHYSICIAN:  Thomas C. Wall, M.D.                DATE OF BIRTH:  [birth year]   DATE OF PROCEDURE:  08/11/2003  DATE OF DISCHARGE:                                  ECHOCARDIOGRAM   INDICATIONS FOR PROCEDURE:  Echocardiogram is compared to that of August 09, 2001.   DIAGNOSIS:  Valvular heart disease - (424.0 and 424.1 and then V43.3)   TECHNICAL QUALITY:  The echocardiogram was of adequate quality.   CONCLUSION:  1. Severe left atrial enlargement.  2. Starr-Edwards mitral valve prosthesis with no mitral regurgitation. Valve     gradient is 6 with a valve area of 1.6 by Doppler cm squared.  3. Normal left ventricular chamber size. There is mild global hypokinesis     with ejection fraction of around 50% to 55%.  4. Stable St. Jude valve in the aortic position with no obvious aortic     insufficiency.  5. Normal right sided structures and function.  6. Mild tricuspid regurgitation with mild elevation of right sided pressure.   IMPRESSION:  Compared to previous studies, this appears to be stable.                                               Thomas C. Wall, M.D.    TCW/MEDQ  D:  08/11/2003  T:  08/12/2003  Job:  [id number]   cc:   Kingsley Callander. Ouida Sills, M.D.  [address]  [place]  [place] [address]  Fax: [phone]   Cl[place] Bing, M.D.

## 2011-04-18 NOTE — Procedures (Signed)
NAMEDARYL, BEEHLER                ACCOUNT NO.:  [id number]   MEDICAL RECORD NO.:  [id number]          PATIENT TYPE:  OUT   LOCATION:  RAD                           FACILITY:  [facility]   PHYSICIAN:  Gerrit Friends. Dietrich Pates, MD, FACCDATE OF BIRTH:  [birth year]   DATE OF PROCEDURE:  DATE OF DISCHARGE:                                  ECHOCARDIOGRAM   CLINICAL DATA:  A 77 year old gentleman with prior mitral and aortic valve  replacement surgery.  M-mode aorta 4.2, left atrium 6.2, septum 1.7,  posterior wall 1.3, LV diastole 5.1, LV systole 4.  1. Technically adequate echocardiographic study.  2. Moderate marked left atrial enlargement; mild right atrial dilatation;      normal right ventricle.  3. Normal tricuspid and pulmonic valve; normal proximal pulmonary artery.      Physiologic tricuspid regurgitation.  4. Mechanical valve in the aortic position; normal hemodynamics; no      insufficiency.  5. Mechanical valve in the mitral position with the appearance of a Starr-      Edwards device; normal hemodynamics by Doppler.  There are mobile      structures in the left ventricle consistent with transected chordae.  6. Left ventricular size is at the upper limit of normal; mild to moderate      hypertrophy with disproportionate septal thickening; normal regional      and global function.  7. Normal IVC.  8. Comparison with prior study of August 28, 2005:  No significant      interval change.      Gerrit Friends. Dietrich Pates, MD, [facility]  Electronically Signed     RMR/MEDQ  D:  10/23/2006  T:  10/23/2006  Job:  [id number]

## 2011-04-18 NOTE — Letter (Signed)
March 03, 2007    Kingsley Callander. Ouida Sills, MD  [address]  [place], [place] [address]   RE:  Dennis, RAMSBURG  MRN:  [id number]  /  DOB:  05-Jun-1932   Dear Channing Mutters:   It was a pleasure to see your nice patient, [name], for followup  on March 03, 2007.   As you know, [name] is a very pleasant 77 year old white married male  followed by me since 1972 because of rheumatic heart disease.   He had an initial mitral commissurotomy by Dr. Arnette Felts at [facility] in  1975.  He subsequently had aortic and mitral valve replacements in 1990  by Dr. Lindaann Pascal in [place].  The mitral valve was replaced with  a Starr-Edwards valve and the aortic valve was replaced with a St. Jude  valve.   The patient has chronic atrial fibrillation, chronic rheumatoid disease.  His cardiac status has been quite stable with normal LV function,  however. his rheumatoid arthritis continues to be symptomatic.  In  addition, over the past few months he has not felt as well with some  decrease in appetite and has lost 6 pounds.  He is concerned that it may  be related to his pain medicine, Ultracet.   He has no shortness of breath or chest pain, does have occasional  dizziness.   His medications in addition to Ultracet include:  1. Hytrin 2 mg daily.  2. Prednisone 5 mg daily.  3. Coumadin.  4. Aspirin 81 mg.  5. Glucosamine.  6. Levothyroxine 125.  7. Altace 10.  8. Terazosin.  9. Lanoxin 0.25 daily.   PHYSICAL EXAMINATION:  Blood pressure 188/50, pulse 60 with atrial  fibrillation.  His weight is down 6 pounds from 146 to 140, respirations  normal.  GENERAL APPEARANCE:  He stable and somewhat chronically ill.  JVP not elevated, carotid pulses palpable, equal without bruits.  LUNGS:  Clear.  CARDIAC:  Reveals normal valve sounds, there is a diastolic murmur  at  the left sternal border.  ABDOMINAL:  Unremarkable.  EXTREMITIES:  Reveal no edema, there is a new area of redness on the  lateral aspect of  the left lower tibial area.   He has chronic rheumatoid arthritic changes.   Recent echocardiogram in November revealed normal LV function, mild-to-  moderate hypertrophy with disproportionate septal thickening.  Normal  regional and global function.  The mechanical valves were stable.   Abdominal ultrasound November 2007 revealed mild atherosclerotic changes  of abdominal aorta with minimal fusiform dilatation, 2.7-cm x 2.7-cm  small right renal cyst.  Carotid Dopplers revealed plaque formation  bilaterally with no high-velocity jets noted.  There was  nonvisualization of flow within the right ventricular artery with  occlusion not excluded.   IMPRESSION:  Diagnoses as above.  The patient is doing quite well  concerning his cardiac status . his present symptoms are worrisome in  that he has lost 6 pounds.  I have suggested he see you for followup in  the near future, and since I will be retiring in June I have suggested  followup with Dr. Lewayne Bunting in our [facility].   Thank so much for the opportunity of sharing this nice gentleman's care,  I certainly hope he will be feeling better in the near future.    Sincerely,      E. Graceann Congress, MD, [facility]  Electronically Signed    EJL/MedQ  DD: 03/03/2007  DT: 03/03/2007  Job #:  [id number] 

## 2011-04-18 NOTE — Procedures (Signed)
[name], [name]                ACCOUNT NO.:  [id number]   MEDICAL RECORD NO.:  [id number]          PATIENT TYPE:  OUT   LOCATION:  RAD                           FACILITY:  [facility]   PHYSICIAN:  Cucumber Bing, M.D. [facility] OF BIRTH:  [birth year]   DATE OF PROCEDURE:  08/28/2005  DATE OF DISCHARGE:                                  ECHOCARDIOGRAM   REFERRING:  Dr. Ouida Sills and Dr. Corinda Gubler.   CLINICAL DATA:  A 77 year old gentleman with previous mitral valve  replacement, aortic valve replacement and CABG surgery. Now presents with  TIA.   M-MODE:  Aorta 3.0, left atrium 8.0, septum 1.6, posterior wall 1.2, LV  diastole 5.0, LV systole 3.8.   1.  Technically adequate echocardiographic study.  2.  Normal right ventricular size and function.  3.  Moderate to marked left atrial enlargement; mild to moderate right      atrial enlargement.  4.  Normal tricuspid valve; physiologic regurgitation; estimated RV systolic      pressure only mildly increased.  5.  Normal pulmonic valve and proximal pulmonary artery.  6.  Mechanical mitral valve, probably of the Star-Edwards variety. A      mechanical valve is also present in the aortic position, probably a St.      Jude type device. Both of these prostheses appeared to function      normally.  7.  Normal internal dimension of the left ventricle; mild to moderate      hypertrophy with disproportionate involvement of the proximal septum.      Septal motion is mildly abnormal but not frankly paradoxic. Overall  LV      systolic performance is normal.  8.  Normal IVC.  9.  Comparison to prior study of June 17, 2004:  LV systolic function has      improved; the magnitude of tricuspid regurgitation has decreased.      [name], M.D. [facility]  Electronically Signed     RR/MEDQ  D:  08/28/2005  T:  08/28/2005  Job:  [id number]

## 2011-04-18 NOTE — Letter (Signed)
September 22, 2006     Kingsley Callander. Ouida Sills, MD  [address]  [place], [place] [address]   RE:  AVA, DEGUIRE  MRN:  [id number]  /  DOB:  09/22/1932   Dear Channing Mutters:   It was a pleasure seeing this patient, [name], for followup on September 22, 2006.   As you known, [name] is a very pleasant, 77 year old, white, married male  followed by me since 1972 because of rheumatic heart disease. He initially  had mitral commissurotomy by Dr. Arnette Felts at [facility] in 1975 as well as some  aortic mitral valve replacements in 1990 by Dr. Nathanial Rancher. At that time, he  had mitral valve replacement and St. Jude aortic valve replacement.   The patient has chronic atrial fibrillation and chronic rheumatoid disease.  His cardiac status has been quite stable; however, his rheumatoid arthritis  has been more symptomatic.   His most recent echo on August 28, 2005 revealed improvement in the left  ventricular function with normal internal dimensions and mild to moderate  hypertrophy.  Systolic function was normal.   MEDICATIONS:  1. Hytrin 2.  2. Prednisone 5.  3. Warfarin.  4. Aspirin 81.  5. Levothyroxine 125 daily.  6. Altace 10.  7. Lanoxin 250.   The patient states that he stopped the Lipitor because of pain.   PHYSICAL EXAMINATION:  VITAL SIGNS:  Blood pressure 108/61, pulse 85, atrial  fib.  GENERAL:  In no severe distress but somewhat uncomfortable related to the  arthritis. JVP is not elevated, carotid pulses are palpated without bruits.  He has a history of carotid stenosis particularly on the left by Doppler of  1993.  LUNGS:  Clear.  CARDIAC:  Reveals atrial fib.  ABDOMEN:  Normal bowel sounds.  EXTREMITIES:  Reveal no edema, and he does have the severe rheumatoid  changes.   IMPRESSION:  Diagnoses as above. He is to remain on the present regimen. I  suggest that he might see Dr. Phylliss Bob in followup concerning his arthritis.  Also, I would like to get a followup carotid Doppler,  2-D echo and lipid,  LFTs and BMET.   Thank you for the opportunity to share in this nice gentleman's care. I plan  to see him again in 4 months or any other time he so desires.   ADDENDUM:  We plan to get an abdominal ultrasound as well.   Sincerely,     ______________________________  E. Graceann Congress, MD, [facility]   VHQ#[id number]  EJL/MedQ  DD: 09/22/2006  DT: 09/23/2006  Job #: [id number]

## 2011-05-02 ENCOUNTER — Ambulatory Visit (INDEPENDENT_AMBULATORY_CARE_PROVIDER_SITE_OTHER): Payer: Medicare Other | Admitting: *Deleted

## 2011-05-02 DIAGNOSIS — I059 Rheumatic mitral valve disease, unspecified: Secondary | ICD-10-CM

## 2011-05-02 DIAGNOSIS — Z7901 Long term (current) use of anticoagulants: Secondary | ICD-10-CM

## 2011-05-02 DIAGNOSIS — I6350 Cerebral infarction due to unspecified occlusion or stenosis of unspecified cerebral artery: Secondary | ICD-10-CM

## 2011-05-02 DIAGNOSIS — Z9889 Other specified postprocedural states: Secondary | ICD-10-CM

## 2011-05-02 DIAGNOSIS — Z8679 Personal history of other diseases of the circulatory system: Secondary | ICD-10-CM

## 2011-05-02 DIAGNOSIS — I359 Nonrheumatic aortic valve disorder, unspecified: Secondary | ICD-10-CM

## 2011-05-02 DIAGNOSIS — Z954 Presence of other heart-valve replacement: Secondary | ICD-10-CM

## 2011-05-23 ENCOUNTER — Ambulatory Visit (INDEPENDENT_AMBULATORY_CARE_PROVIDER_SITE_OTHER): Payer: Medicare Other | Admitting: *Deleted

## 2011-05-23 DIAGNOSIS — I6350 Cerebral infarction due to unspecified occlusion or stenosis of unspecified cerebral artery: Secondary | ICD-10-CM

## 2011-05-23 DIAGNOSIS — Z7901 Long term (current) use of anticoagulants: Secondary | ICD-10-CM

## 2011-05-23 DIAGNOSIS — Z9889 Other specified postprocedural states: Secondary | ICD-10-CM

## 2011-05-23 DIAGNOSIS — I059 Rheumatic mitral valve disease, unspecified: Secondary | ICD-10-CM

## 2011-05-23 DIAGNOSIS — I359 Nonrheumatic aortic valve disorder, unspecified: Secondary | ICD-10-CM

## 2011-05-23 DIAGNOSIS — Z8679 Personal history of other diseases of the circulatory system: Secondary | ICD-10-CM

## 2011-05-23 DIAGNOSIS — Z954 Presence of other heart-valve replacement: Secondary | ICD-10-CM

## 2011-05-27 ENCOUNTER — Ambulatory Visit (INDEPENDENT_AMBULATORY_CARE_PROVIDER_SITE_OTHER): Payer: Medicare Other | Admitting: Cardiology

## 2011-05-27 ENCOUNTER — Encounter: Payer: Self-pay | Admitting: Cardiology

## 2011-05-27 VITALS — BP 124/72 | HR 64 | Ht 69.0 in | Wt 158.0 lb

## 2011-05-27 DIAGNOSIS — K8309 Other cholangitis: Secondary | ICD-10-CM | POA: Insufficient documentation

## 2011-05-27 DIAGNOSIS — I509 Heart failure, unspecified: Secondary | ICD-10-CM

## 2011-05-27 DIAGNOSIS — Z7901 Long term (current) use of anticoagulants: Secondary | ICD-10-CM | POA: Insufficient documentation

## 2011-05-27 DIAGNOSIS — I059 Rheumatic mitral valve disease, unspecified: Secondary | ICD-10-CM

## 2011-05-27 DIAGNOSIS — I4891 Unspecified atrial fibrillation: Secondary | ICD-10-CM

## 2011-05-27 DIAGNOSIS — I4821 Permanent atrial fibrillation: Secondary | ICD-10-CM

## 2011-05-27 DIAGNOSIS — I519 Heart disease, unspecified: Secondary | ICD-10-CM | POA: Insufficient documentation

## 2011-05-27 DIAGNOSIS — I099 Rheumatic heart disease, unspecified: Secondary | ICD-10-CM

## 2011-05-27 DIAGNOSIS — I359 Nonrheumatic aortic valve disorder, unspecified: Secondary | ICD-10-CM

## 2011-05-27 DIAGNOSIS — I669 Occlusion and stenosis of unspecified cerebral artery: Secondary | ICD-10-CM

## 2011-05-27 DIAGNOSIS — M069 Rheumatoid arthritis, unspecified: Secondary | ICD-10-CM | POA: Insufficient documentation

## 2011-05-27 MED ORDER — FUROSEMIDE 20 MG PO TABS: 20.0000 mg | ORAL_TABLET | Freq: Every day | ORAL | Status: DC

## 2011-05-27 NOTE — Assessment & Plan Note (Signed)
No pathological murmur related to aortic valve prosthesis.

## 2011-05-27 NOTE — Patient Instructions (Signed)
Your physician recommends that you continue on your current medications as directed. Please refer to the Current Medication list given to you today. Your physician wants you to follow-up in: 6 months. You will receive a reminder letter in the mail one-two months in advance. If you don't receive a letter, please call our office to schedule the follow-up appointment.

## 2011-05-27 NOTE — Assessment & Plan Note (Signed)
Normal functioning mitral valve prosthesis by physical examination

## 2011-05-27 NOTE — Assessment & Plan Note (Signed)
Patient does report some increased shortness of breath. Have asked for Dr. Ouida Sills to add a BNP level to his upcoming lab work and if abnormal we will perform an echocardiogram.

## 2011-05-27 NOTE — Assessment & Plan Note (Signed)
No complications, no pathological murmurs

## 2011-05-27 NOTE — Assessment & Plan Note (Signed)
No evidence of recurrent cerebral thromboembolism. Therapeutic INR has been maintained. The patient has some short-term memory loss. It could be related to opioids or maybe some early dementia. I've asked him to discuss this with Dr. Ouida Sills.

## 2011-05-27 NOTE — Progress Notes (Signed)
HPI The patient is a 77 year old male with a history of St. Jude aortic valve replacement as well as a Starr-Edwards mitral valve replacement secondary to rheumatic heart disease. He has mild LV dysfunction. He is in permanent atrial fibrillation and his prior history was complicated by cerebral emboli secondary to subtherapeutic INR. He has now remained therapeutic with a goal INR of 3.5-4.5. Last year the patient had a bout of cholangitis with Escherichia coli bacteremia, fortunately he did not develop endocarditis. The patient is immunocompromised because of chronic steroid therapy due to severe rheumatoid arthritis. From a cardiovascular standpoint is been doing well. He has remained compliant with Coumadin. He reports no bleeding complications. He denies any chest pain. He does have some shortness of breath he seemed to have increased over the last several months on exertion. He has however no other symptoms of heart failure including orthopnea and PND. His wife noticed that he has developed some problems with short-term memory loss. He reports no palpitations or syncope.  Allergies  Allergen Reactions  . Codeine   . Penicillins     Current Outpatient Prescriptions on File Prior to Visit  Medication Sig Dispense Refill  . metoprolol tartrate (LOPRESSOR) 25 MG tablet TAKE (1/2) TABLET BY MOUTH TWICE DAILY.  30 tablet  6  . warfarin (COUMADIN) 5 MG tablet Take by mouth as directed.          Past Medical History  Diagnosis Date  . LV dysfunction      ejection fraction 45-50%  . Rheumatic heart disease      status post St. Jude aortic valve Starr-Edwards mitral valve replacement  . Permanent atrial fibrillation   . Cerebral embolism     Secondary to subtherapeutic INR  . Abdominal aortic aneurysm     small aneurysm  . Hypertension   . Rheumatoid arthritis     Severe requiring steroid therapy  . Gastric erosions      resolved  . Cholangitis     Complicated by Escherichia coli  bacteremia without endocarditis status post ERCP and sphincterotomy.  . Chronic anticoagulation      INR 3.5-4.5    No past surgical history on file.  No family history on file.  History   Social History  . Marital Status: Married    Spouse Name: N/A    Number of Children: N/A  . Years of Education: N/A   Occupational History  . Not on file.   Social History Main Topics  . Smoking status: Former Smoker -- 1.0 packs/day for 25 years    Types: Cigarettes    Quit date: 12/01/1973  . Smokeless tobacco: Never Used  . Alcohol Use: Not on file  . Drug Use: Not on file  . Sexually Active: Not on file   Other Topics Concern  . Not on file   Social History Narrative  . No narrative on file   OAC:ZYSAYTKZS positives as outlined above. The remainder of the 18  point review of systems is negative  PHYSICAL EXAM BP 124/72  Pulse 64  Ht 5\' 9"  (1.753 m)  Wt 158 lb (71.668 kg)  BMI 23.33 kg/m2  General: Well-developed, well-nourished in no distress Head: Normocephalic and atraumatic Eyes:PERRLA/EOMI intact, conjunctiva and lids normal Ears: No deformity or lesions Mouth:normal dentition, normal posterior pharynx Neck: Supple, no JVD.  No masses, thyromegaly or abnormal cervical nodes. No carotid bruits Lungs: Normal breath sounds bilaterally without wheezing.  Normal percussion Cardiac: Irregular rate and rhythm with normal closing  click of S1 and S2. S1 click is consistent with a normal functioning Starr-Edwards valve., no S3 or S4.  PMI is normal.  No pathological murmurs Abdomen: Normal bowel sounds, abdomen is soft and nontender without masses, organomegaly or hernias noted.  No hepatosplenomegaly MSK: Kyphosis with difficulty of gait, chronic joint deformities related to rheumatoid arthritis  Vascular: Pulse is normal in all 4 extremities Extremities: Dependent edema secondary to venous stasis. Neurologic: Alert and oriented x 3 Skin: Erythematous rash lower  extremities Lymphatics: No significant adenopathy Psychologic: Normal affect   ECG: Not available  ASSESSMENT AND PLAN

## 2011-05-27 NOTE — Assessment & Plan Note (Signed)
Rate well controlled without any symptoms of palpitations.

## 2011-06-10 ENCOUNTER — Ambulatory Visit (INDEPENDENT_AMBULATORY_CARE_PROVIDER_SITE_OTHER): Payer: Medicare Other | Admitting: *Deleted

## 2011-06-10 DIAGNOSIS — Z954 Presence of other heart-valve replacement: Secondary | ICD-10-CM

## 2011-06-10 DIAGNOSIS — I6350 Cerebral infarction due to unspecified occlusion or stenosis of unspecified cerebral artery: Secondary | ICD-10-CM

## 2011-06-10 DIAGNOSIS — Z9889 Other specified postprocedural states: Secondary | ICD-10-CM

## 2011-06-10 DIAGNOSIS — Z7901 Long term (current) use of anticoagulants: Secondary | ICD-10-CM

## 2011-06-10 DIAGNOSIS — I359 Nonrheumatic aortic valve disorder, unspecified: Secondary | ICD-10-CM

## 2011-06-10 DIAGNOSIS — I059 Rheumatic mitral valve disease, unspecified: Secondary | ICD-10-CM

## 2011-06-10 DIAGNOSIS — Z8679 Personal history of other diseases of the circulatory system: Secondary | ICD-10-CM

## 2011-07-01 ENCOUNTER — Ambulatory Visit (INDEPENDENT_AMBULATORY_CARE_PROVIDER_SITE_OTHER): Payer: Medicare Other | Admitting: *Deleted

## 2011-07-01 DIAGNOSIS — I059 Rheumatic mitral valve disease, unspecified: Secondary | ICD-10-CM

## 2011-07-01 DIAGNOSIS — I6350 Cerebral infarction due to unspecified occlusion or stenosis of unspecified cerebral artery: Secondary | ICD-10-CM

## 2011-07-01 DIAGNOSIS — Z7901 Long term (current) use of anticoagulants: Secondary | ICD-10-CM

## 2011-07-01 DIAGNOSIS — I359 Nonrheumatic aortic valve disorder, unspecified: Secondary | ICD-10-CM

## 2011-07-01 DIAGNOSIS — Z954 Presence of other heart-valve replacement: Secondary | ICD-10-CM

## 2011-07-01 DIAGNOSIS — Z8679 Personal history of other diseases of the circulatory system: Secondary | ICD-10-CM

## 2011-07-01 DIAGNOSIS — Z9889 Other specified postprocedural states: Secondary | ICD-10-CM

## 2011-07-22 ENCOUNTER — Ambulatory Visit (INDEPENDENT_AMBULATORY_CARE_PROVIDER_SITE_OTHER): Payer: Medicare Other | Admitting: *Deleted

## 2011-07-22 DIAGNOSIS — I359 Nonrheumatic aortic valve disorder, unspecified: Secondary | ICD-10-CM

## 2011-07-22 DIAGNOSIS — Z954 Presence of other heart-valve replacement: Secondary | ICD-10-CM

## 2011-07-22 DIAGNOSIS — I6350 Cerebral infarction due to unspecified occlusion or stenosis of unspecified cerebral artery: Secondary | ICD-10-CM

## 2011-07-22 DIAGNOSIS — I059 Rheumatic mitral valve disease, unspecified: Secondary | ICD-10-CM

## 2011-07-22 DIAGNOSIS — Z9889 Other specified postprocedural states: Secondary | ICD-10-CM

## 2011-07-22 DIAGNOSIS — Z7901 Long term (current) use of anticoagulants: Secondary | ICD-10-CM

## 2011-07-22 DIAGNOSIS — Z8679 Personal history of other diseases of the circulatory system: Secondary | ICD-10-CM

## 2011-07-22 LAB — POCT INR: INR: 3.2

## 2011-08-06 ENCOUNTER — Other Ambulatory Visit: Payer: Self-pay | Admitting: Cardiology

## 2011-08-08 ENCOUNTER — Ambulatory Visit (INDEPENDENT_AMBULATORY_CARE_PROVIDER_SITE_OTHER): Payer: Medicare Other | Admitting: *Deleted

## 2011-08-08 DIAGNOSIS — I059 Rheumatic mitral valve disease, unspecified: Secondary | ICD-10-CM

## 2011-08-08 DIAGNOSIS — Z7901 Long term (current) use of anticoagulants: Secondary | ICD-10-CM

## 2011-08-08 DIAGNOSIS — I359 Nonrheumatic aortic valve disorder, unspecified: Secondary | ICD-10-CM

## 2011-08-08 DIAGNOSIS — Z8679 Personal history of other diseases of the circulatory system: Secondary | ICD-10-CM

## 2011-08-08 DIAGNOSIS — Z954 Presence of other heart-valve replacement: Secondary | ICD-10-CM

## 2011-08-08 DIAGNOSIS — Z9889 Other specified postprocedural states: Secondary | ICD-10-CM

## 2011-08-08 DIAGNOSIS — I6350 Cerebral infarction due to unspecified occlusion or stenosis of unspecified cerebral artery: Secondary | ICD-10-CM

## 2011-08-08 LAB — POCT INR: INR: 3

## 2011-08-26 ENCOUNTER — Ambulatory Visit (INDEPENDENT_AMBULATORY_CARE_PROVIDER_SITE_OTHER): Payer: Medicare Other | Admitting: *Deleted

## 2011-08-26 DIAGNOSIS — Z8679 Personal history of other diseases of the circulatory system: Secondary | ICD-10-CM

## 2011-08-26 DIAGNOSIS — I6350 Cerebral infarction due to unspecified occlusion or stenosis of unspecified cerebral artery: Secondary | ICD-10-CM

## 2011-08-26 DIAGNOSIS — I059 Rheumatic mitral valve disease, unspecified: Secondary | ICD-10-CM

## 2011-08-26 DIAGNOSIS — Z954 Presence of other heart-valve replacement: Secondary | ICD-10-CM

## 2011-08-26 DIAGNOSIS — I359 Nonrheumatic aortic valve disorder, unspecified: Secondary | ICD-10-CM

## 2011-08-26 DIAGNOSIS — Z7901 Long term (current) use of anticoagulants: Secondary | ICD-10-CM

## 2011-08-26 DIAGNOSIS — Z9889 Other specified postprocedural states: Secondary | ICD-10-CM

## 2011-08-26 LAB — POCT INR: INR: 4.6

## 2011-09-12 ENCOUNTER — Ambulatory Visit (INDEPENDENT_AMBULATORY_CARE_PROVIDER_SITE_OTHER): Payer: Medicare Other | Admitting: *Deleted

## 2011-09-12 DIAGNOSIS — Z954 Presence of other heart-valve replacement: Secondary | ICD-10-CM

## 2011-09-12 DIAGNOSIS — I059 Rheumatic mitral valve disease, unspecified: Secondary | ICD-10-CM

## 2011-09-12 DIAGNOSIS — Z8679 Personal history of other diseases of the circulatory system: Secondary | ICD-10-CM

## 2011-09-12 DIAGNOSIS — I6350 Cerebral infarction due to unspecified occlusion or stenosis of unspecified cerebral artery: Secondary | ICD-10-CM

## 2011-09-12 DIAGNOSIS — Z7901 Long term (current) use of anticoagulants: Secondary | ICD-10-CM

## 2011-09-12 DIAGNOSIS — I359 Nonrheumatic aortic valve disorder, unspecified: Secondary | ICD-10-CM

## 2011-09-12 DIAGNOSIS — Z9889 Other specified postprocedural states: Secondary | ICD-10-CM

## 2011-09-12 LAB — POCT INR: INR: 4.6

## 2011-09-29 ENCOUNTER — Other Ambulatory Visit (HOSPITAL_COMMUNITY): Payer: Self-pay | Admitting: Internal Medicine

## 2011-09-29 ENCOUNTER — Encounter: Payer: Self-pay | Admitting: Cardiology

## 2011-09-29 ENCOUNTER — Ambulatory Visit (INDEPENDENT_AMBULATORY_CARE_PROVIDER_SITE_OTHER): Payer: Medicare Other | Admitting: *Deleted

## 2011-09-29 ENCOUNTER — Ambulatory Visit (INDEPENDENT_AMBULATORY_CARE_PROVIDER_SITE_OTHER): Payer: Medicare Other | Admitting: Cardiology

## 2011-09-29 VITALS — BP 120/74 | HR 71 | Ht 68.0 in | Wt 161.0 lb

## 2011-09-29 DIAGNOSIS — I4891 Unspecified atrial fibrillation: Secondary | ICD-10-CM

## 2011-09-29 DIAGNOSIS — I099 Rheumatic heart disease, unspecified: Secondary | ICD-10-CM

## 2011-09-29 DIAGNOSIS — Z7901 Long term (current) use of anticoagulants: Secondary | ICD-10-CM

## 2011-09-29 DIAGNOSIS — I509 Heart failure, unspecified: Secondary | ICD-10-CM

## 2011-09-29 DIAGNOSIS — Z8679 Personal history of other diseases of the circulatory system: Secondary | ICD-10-CM

## 2011-09-29 DIAGNOSIS — R413 Other amnesia: Secondary | ICD-10-CM

## 2011-09-29 DIAGNOSIS — Z9889 Other specified postprocedural states: Secondary | ICD-10-CM

## 2011-09-29 DIAGNOSIS — I059 Rheumatic mitral valve disease, unspecified: Secondary | ICD-10-CM

## 2011-09-29 DIAGNOSIS — I519 Heart disease, unspecified: Secondary | ICD-10-CM

## 2011-09-29 DIAGNOSIS — I6350 Cerebral infarction due to unspecified occlusion or stenosis of unspecified cerebral artery: Secondary | ICD-10-CM

## 2011-09-29 DIAGNOSIS — I70209 Unspecified atherosclerosis of native arteries of extremities, unspecified extremity: Secondary | ICD-10-CM

## 2011-09-29 DIAGNOSIS — L98499 Non-pressure chronic ulcer of skin of other sites with unspecified severity: Secondary | ICD-10-CM

## 2011-09-29 DIAGNOSIS — Z954 Presence of other heart-valve replacement: Secondary | ICD-10-CM

## 2011-09-29 DIAGNOSIS — I4821 Permanent atrial fibrillation: Secondary | ICD-10-CM

## 2011-09-29 DIAGNOSIS — I359 Nonrheumatic aortic valve disorder, unspecified: Secondary | ICD-10-CM

## 2011-09-29 LAB — POCT INR: INR: 4.3

## 2011-09-29 NOTE — Assessment & Plan Note (Signed)
No bleeding complications 

## 2011-09-29 NOTE — Assessment & Plan Note (Signed)
No heart failure symptoms. Continue current medical regimen 

## 2011-09-29 NOTE — Assessment & Plan Note (Signed)
No definite heart failure symptoms. Continue current medical regimen

## 2011-09-29 NOTE — Patient Instructions (Signed)
Your physician recommends that you schedule a follow-up appointment in: 3 months. You will receive a letter in the mail in about 1-2 months reminding you to call and schedule your appointment. If you don't receive this letter, please contact our office.  Your physician recommends that you continue on your current medications as directed. Please refer to the Current Medication list given to you today.

## 2011-09-29 NOTE — Progress Notes (Signed)
History of present illness:  The patient is a 77 year old male with a history of St. Jude aortic valve replacement a Starr-Edwards mitral valve replacement secondary to rheumatic heart disease. He has mild LV dysfunction. He is in permanent atrial fibrillation. He has a prior history of cerebral emboli in the setting of subtherapeutic INR. Current goal INR is 3.5-4.5. From a cardiac standpoint is been doing well. He reports no chest pain shortness of breath orthopnea PND. His wife has ordered an early visits to discuss a recent wound infection. The patient on the left leg had developed an ulcer which required care by the wound Center. ABIs were also done as part of the evaluation which showed that the patient has severe peripheral vascular disease particularly in the left leg. However the ABIs are very high due to calcified vessels. In the left ankle however the patient had a monophasic waveform. Also had very poor flow at the level of the metatarsal a left digit. His wound however has healed in the meanwhile. Apparently recommendation was given to consider angiography and intervention and wife is here to discuss this. For her. Also has some lower extremity edema but this has now also resolved.   Allergies, family history and social history: As documented in chart and reviewed  Medications: Documented and reviewed in chart.  Past medical history: Reviewed and see problem list below   Review of systems: No nausea or vomiting. No fever or chills. No melena or hematochezia. No bleeding complications. Status post healing wound in the left leg. No definite claudication but the patient is not very active and is limited in his mobility because of his rheumatoid arthritis.   Physical examination : Vital signs documented below General: Alert white male with limited mobility due to to his severe rheumatoid arthritis HEENT: Poor dentition. EOMI, PERRLA, normal carotid upstroke bilaterally no definite bruits.  No thyromegaly nonnodular thyroid Lungs: Clear breath sounds bilaterally Heart: Irregular rate and rhythm with normal closing click of S1 as well as closing click of S2. Soft systolic murmur at the left lower sternal border Abdomen: Soft nontender no rebound or guarding good bowel sounds. Extremity exam: No cyanosis clubbing or edema, friable skin wound dressing in place on the left ankle Neurologic: Alert and oriented and grossly nonfocal Psychiatric: Normal affect Vascular exam: Dorsalis pedis and posterior tibial pulses are not palpable.  ABIs see chart

## 2011-09-29 NOTE — Assessment & Plan Note (Signed)
Normal valve examination for  St. Jude and Starr-Edwards valve.

## 2011-09-29 NOTE — Assessment & Plan Note (Signed)
Rate controlled and patient remains on Coumadin.

## 2011-09-29 NOTE — Assessment & Plan Note (Signed)
Patient does not have any resting claudication and his wound is healing. Based on his ABIs is very likely the patient's severe distal disease, likely not amenable to intervention. The patient also is very complicated and is on high-dose Coumadin with high INR. Unless he has life-threatening limb ischemia I think continue conservative therapy is most appropriate. I discussed this carefully with his wife as well as with the patient. They both agree.

## 2011-10-13 ENCOUNTER — Ambulatory Visit (HOSPITAL_COMMUNITY)
Admission: RE | Admit: 2011-10-13 | Discharge: 2011-10-13 | Disposition: A | Discharge status: Home / Self Care | Payer: Medicare Other | Source: Ambulatory Visit | Attending: Internal Medicine | Admitting: Internal Medicine

## 2011-10-13 DIAGNOSIS — R413 Other amnesia: Secondary | ICD-10-CM | POA: Insufficient documentation

## 2011-10-21 ENCOUNTER — Encounter: Payer: Medicare Other | Admitting: *Deleted

## 2011-10-24 ENCOUNTER — Ambulatory Visit (INDEPENDENT_AMBULATORY_CARE_PROVIDER_SITE_OTHER): Payer: Medicare Other | Admitting: *Deleted

## 2011-10-24 DIAGNOSIS — Z954 Presence of other heart-valve replacement: Secondary | ICD-10-CM

## 2011-10-24 DIAGNOSIS — I359 Nonrheumatic aortic valve disorder, unspecified: Secondary | ICD-10-CM

## 2011-10-24 DIAGNOSIS — Z9889 Other specified postprocedural states: Secondary | ICD-10-CM

## 2011-10-24 DIAGNOSIS — I059 Rheumatic mitral valve disease, unspecified: Secondary | ICD-10-CM

## 2011-10-24 DIAGNOSIS — Z7901 Long term (current) use of anticoagulants: Secondary | ICD-10-CM

## 2011-10-24 DIAGNOSIS — Z8679 Personal history of other diseases of the circulatory system: Secondary | ICD-10-CM

## 2011-10-24 DIAGNOSIS — I6350 Cerebral infarction due to unspecified occlusion or stenosis of unspecified cerebral artery: Secondary | ICD-10-CM

## 2011-11-14 ENCOUNTER — Ambulatory Visit (INDEPENDENT_AMBULATORY_CARE_PROVIDER_SITE_OTHER): Payer: Medicare Other | Admitting: *Deleted

## 2011-11-14 DIAGNOSIS — Z9889 Other specified postprocedural states: Secondary | ICD-10-CM

## 2011-11-14 DIAGNOSIS — Z7901 Long term (current) use of anticoagulants: Secondary | ICD-10-CM

## 2011-11-14 DIAGNOSIS — I6350 Cerebral infarction due to unspecified occlusion or stenosis of unspecified cerebral artery: Secondary | ICD-10-CM

## 2011-11-14 DIAGNOSIS — Z954 Presence of other heart-valve replacement: Secondary | ICD-10-CM

## 2011-11-14 DIAGNOSIS — Z8679 Personal history of other diseases of the circulatory system: Secondary | ICD-10-CM

## 2011-11-14 DIAGNOSIS — I359 Nonrheumatic aortic valve disorder, unspecified: Secondary | ICD-10-CM

## 2011-11-14 DIAGNOSIS — I059 Rheumatic mitral valve disease, unspecified: Secondary | ICD-10-CM

## 2011-12-05 ENCOUNTER — Ambulatory Visit (INDEPENDENT_AMBULATORY_CARE_PROVIDER_SITE_OTHER): Payer: Medicare Other | Admitting: *Deleted

## 2011-12-05 DIAGNOSIS — Z9889 Other specified postprocedural states: Secondary | ICD-10-CM

## 2011-12-05 DIAGNOSIS — I6350 Cerebral infarction due to unspecified occlusion or stenosis of unspecified cerebral artery: Secondary | ICD-10-CM

## 2011-12-05 DIAGNOSIS — Z8679 Personal history of other diseases of the circulatory system: Secondary | ICD-10-CM

## 2011-12-05 DIAGNOSIS — I359 Nonrheumatic aortic valve disorder, unspecified: Secondary | ICD-10-CM

## 2011-12-05 DIAGNOSIS — Z954 Presence of other heart-valve replacement: Secondary | ICD-10-CM

## 2011-12-05 DIAGNOSIS — I059 Rheumatic mitral valve disease, unspecified: Secondary | ICD-10-CM

## 2011-12-05 DIAGNOSIS — Z7901 Long term (current) use of anticoagulants: Secondary | ICD-10-CM

## 2011-12-23 ENCOUNTER — Ambulatory Visit (INDEPENDENT_AMBULATORY_CARE_PROVIDER_SITE_OTHER): Payer: Medicare Other | Admitting: *Deleted

## 2011-12-23 DIAGNOSIS — I6350 Cerebral infarction due to unspecified occlusion or stenosis of unspecified cerebral artery: Secondary | ICD-10-CM

## 2011-12-23 DIAGNOSIS — Z8679 Personal history of other diseases of the circulatory system: Secondary | ICD-10-CM

## 2011-12-23 DIAGNOSIS — I359 Nonrheumatic aortic valve disorder, unspecified: Secondary | ICD-10-CM

## 2011-12-23 DIAGNOSIS — Z954 Presence of other heart-valve replacement: Secondary | ICD-10-CM

## 2011-12-23 DIAGNOSIS — I059 Rheumatic mitral valve disease, unspecified: Secondary | ICD-10-CM

## 2011-12-23 DIAGNOSIS — Z7901 Long term (current) use of anticoagulants: Secondary | ICD-10-CM

## 2011-12-23 DIAGNOSIS — Z9889 Other specified postprocedural states: Secondary | ICD-10-CM

## 2011-12-23 LAB — POCT INR: INR: 4.3

## 2012-01-13 ENCOUNTER — Ambulatory Visit (INDEPENDENT_AMBULATORY_CARE_PROVIDER_SITE_OTHER): Payer: Medicare Other | Admitting: *Deleted

## 2012-01-13 DIAGNOSIS — Z954 Presence of other heart-valve replacement: Secondary | ICD-10-CM

## 2012-01-13 DIAGNOSIS — I359 Nonrheumatic aortic valve disorder, unspecified: Secondary | ICD-10-CM

## 2012-01-13 DIAGNOSIS — Z8679 Personal history of other diseases of the circulatory system: Secondary | ICD-10-CM

## 2012-01-13 DIAGNOSIS — Z7901 Long term (current) use of anticoagulants: Secondary | ICD-10-CM

## 2012-01-13 DIAGNOSIS — I059 Rheumatic mitral valve disease, unspecified: Secondary | ICD-10-CM

## 2012-01-13 DIAGNOSIS — I6350 Cerebral infarction due to unspecified occlusion or stenosis of unspecified cerebral artery: Secondary | ICD-10-CM

## 2012-01-13 DIAGNOSIS — Z9889 Other specified postprocedural states: Secondary | ICD-10-CM

## 2012-01-13 LAB — POCT INR: INR: 4.3

## 2012-01-30 ENCOUNTER — Ambulatory Visit (INDEPENDENT_AMBULATORY_CARE_PROVIDER_SITE_OTHER): Payer: Medicare Other | Admitting: *Deleted

## 2012-01-30 DIAGNOSIS — Z8679 Personal history of other diseases of the circulatory system: Secondary | ICD-10-CM

## 2012-01-30 DIAGNOSIS — Z954 Presence of other heart-valve replacement: Secondary | ICD-10-CM

## 2012-01-30 DIAGNOSIS — I359 Nonrheumatic aortic valve disorder, unspecified: Secondary | ICD-10-CM

## 2012-01-30 DIAGNOSIS — Z9889 Other specified postprocedural states: Secondary | ICD-10-CM

## 2012-01-30 DIAGNOSIS — I6350 Cerebral infarction due to unspecified occlusion or stenosis of unspecified cerebral artery: Secondary | ICD-10-CM

## 2012-01-30 DIAGNOSIS — Z7901 Long term (current) use of anticoagulants: Secondary | ICD-10-CM

## 2012-01-30 DIAGNOSIS — I059 Rheumatic mitral valve disease, unspecified: Secondary | ICD-10-CM

## 2012-01-30 LAB — POCT INR: INR: 5.5

## 2012-02-10 ENCOUNTER — Telehealth: Payer: Self-pay | Admitting: *Deleted

## 2012-02-10 NOTE — Telephone Encounter (Signed)
Pt is supposed to have a PT on Friday. Pt's wife, [name], states he ate breakfast yesterday morning. He then had vomiting and diarrhea. She states he went back to the bathroom several more times with diarrhea. There was blood (a lot) in the toilet with the diarrhea. The blood was red at that time. She states this am he ate a boiled egg and went back to the bathroom with vomiting and bloody diarrhea. The blood was both red and dark in color. She states he is not running a fever but does have abdominal pain "like something burst in his stomach." Pt's wife advised pt should be evaluated in the ER ASAP for symptoms described. Pt's wife states she can't get him to the ER. Notified pt's wife she should call 911 and have him transported to the ER ASAP.

## 2012-02-11 DIAGNOSIS — I359 Nonrheumatic aortic valve disorder, unspecified: Secondary | ICD-10-CM

## 2012-02-11 DIAGNOSIS — I059 Rheumatic mitral valve disease, unspecified: Secondary | ICD-10-CM

## 2012-02-16 ENCOUNTER — Ambulatory Visit: Payer: Self-pay | Admitting: *Deleted

## 2012-02-16 DIAGNOSIS — Z7901 Long term (current) use of anticoagulants: Secondary | ICD-10-CM

## 2012-02-16 DIAGNOSIS — Z9889 Other specified postprocedural states: Secondary | ICD-10-CM

## 2012-02-16 DIAGNOSIS — I359 Nonrheumatic aortic valve disorder, unspecified: Secondary | ICD-10-CM

## 2012-02-16 DIAGNOSIS — Z954 Presence of other heart-valve replacement: Secondary | ICD-10-CM

## 2012-02-16 DIAGNOSIS — I059 Rheumatic mitral valve disease, unspecified: Secondary | ICD-10-CM

## 2012-02-16 DIAGNOSIS — Z8679 Personal history of other diseases of the circulatory system: Secondary | ICD-10-CM

## 2012-02-16 LAB — POCT INR: INR: 3.1

## 2012-02-17 ENCOUNTER — Other Ambulatory Visit: Payer: Self-pay | Admitting: Cardiology

## 2012-02-18 ENCOUNTER — Ambulatory Visit: Payer: Self-pay | Admitting: *Deleted

## 2012-02-18 DIAGNOSIS — Z8679 Personal history of other diseases of the circulatory system: Secondary | ICD-10-CM

## 2012-02-18 DIAGNOSIS — Z7901 Long term (current) use of anticoagulants: Secondary | ICD-10-CM

## 2012-02-18 DIAGNOSIS — Z9889 Other specified postprocedural states: Secondary | ICD-10-CM

## 2012-02-18 DIAGNOSIS — I359 Nonrheumatic aortic valve disorder, unspecified: Secondary | ICD-10-CM

## 2012-02-18 DIAGNOSIS — I059 Rheumatic mitral valve disease, unspecified: Secondary | ICD-10-CM

## 2012-02-18 DIAGNOSIS — Z954 Presence of other heart-valve replacement: Secondary | ICD-10-CM

## 2012-02-20 ENCOUNTER — Ambulatory Visit: Payer: Self-pay | Admitting: *Deleted

## 2012-02-20 DIAGNOSIS — Z9889 Other specified postprocedural states: Secondary | ICD-10-CM

## 2012-02-20 DIAGNOSIS — Z8679 Personal history of other diseases of the circulatory system: Secondary | ICD-10-CM

## 2012-02-20 DIAGNOSIS — I359 Nonrheumatic aortic valve disorder, unspecified: Secondary | ICD-10-CM

## 2012-02-20 DIAGNOSIS — I059 Rheumatic mitral valve disease, unspecified: Secondary | ICD-10-CM

## 2012-02-20 DIAGNOSIS — Z7901 Long term (current) use of anticoagulants: Secondary | ICD-10-CM

## 2012-02-20 DIAGNOSIS — Z954 Presence of other heart-valve replacement: Secondary | ICD-10-CM

## 2012-02-24 ENCOUNTER — Ambulatory Visit: Payer: Self-pay | Admitting: *Deleted

## 2012-02-24 DIAGNOSIS — Z954 Presence of other heart-valve replacement: Secondary | ICD-10-CM

## 2012-02-24 DIAGNOSIS — Z7901 Long term (current) use of anticoagulants: Secondary | ICD-10-CM

## 2012-02-24 DIAGNOSIS — I059 Rheumatic mitral valve disease, unspecified: Secondary | ICD-10-CM

## 2012-02-24 DIAGNOSIS — Z8679 Personal history of other diseases of the circulatory system: Secondary | ICD-10-CM

## 2012-02-24 DIAGNOSIS — Z9889 Other specified postprocedural states: Secondary | ICD-10-CM

## 2012-02-24 DIAGNOSIS — I359 Nonrheumatic aortic valve disorder, unspecified: Secondary | ICD-10-CM

## 2012-02-24 LAB — POCT INR: INR: 3.5

## 2012-03-02 ENCOUNTER — Ambulatory Visit: Payer: Self-pay | Admitting: *Deleted

## 2012-03-02 DIAGNOSIS — Z8679 Personal history of other diseases of the circulatory system: Secondary | ICD-10-CM

## 2012-03-02 DIAGNOSIS — Z7901 Long term (current) use of anticoagulants: Secondary | ICD-10-CM

## 2012-03-02 DIAGNOSIS — Z954 Presence of other heart-valve replacement: Secondary | ICD-10-CM

## 2012-03-02 DIAGNOSIS — Z9889 Other specified postprocedural states: Secondary | ICD-10-CM

## 2012-03-02 DIAGNOSIS — I059 Rheumatic mitral valve disease, unspecified: Secondary | ICD-10-CM

## 2012-03-02 DIAGNOSIS — I359 Nonrheumatic aortic valve disorder, unspecified: Secondary | ICD-10-CM

## 2012-03-12 ENCOUNTER — Ambulatory Visit: Payer: Self-pay | Admitting: Internal Medicine

## 2012-03-12 DIAGNOSIS — Z954 Presence of other heart-valve replacement: Secondary | ICD-10-CM

## 2012-03-12 DIAGNOSIS — Z8679 Personal history of other diseases of the circulatory system: Secondary | ICD-10-CM

## 2012-03-12 DIAGNOSIS — Z9889 Other specified postprocedural states: Secondary | ICD-10-CM

## 2012-03-12 DIAGNOSIS — Z7901 Long term (current) use of anticoagulants: Secondary | ICD-10-CM

## 2012-03-12 DIAGNOSIS — I359 Nonrheumatic aortic valve disorder, unspecified: Secondary | ICD-10-CM

## 2012-03-12 DIAGNOSIS — I059 Rheumatic mitral valve disease, unspecified: Secondary | ICD-10-CM

## 2012-03-13 ENCOUNTER — Other Ambulatory Visit: Payer: Self-pay | Admitting: Cardiology

## 2012-03-13 ENCOUNTER — Telehealth: Payer: Self-pay | Admitting: Physician Assistant

## 2012-03-13 NOTE — Telephone Encounter (Signed)
Bonita Quin (labs with [facility]) called re: question of patient's PT/INR yesterday. On calling back, she states that several finger sticks revealed random INR levels between 2.0 and 5.0 at that time. On venipuncture today, INR is therapeutic at 3.7 (INR to be between 3.5 and 4.5 per Dr. Tommi Rumps Gent's last note). She requested recs on Coumadin dosing. I advised to continue current regimen given therapeutic INR. She understood and agreed.    Jacqulyn Bath, PA-C 03/13/2012 4:03 PM

## 2012-03-18 ENCOUNTER — Ambulatory Visit: Payer: Self-pay | Admitting: *Deleted

## 2012-03-18 DIAGNOSIS — I359 Nonrheumatic aortic valve disorder, unspecified: Secondary | ICD-10-CM

## 2012-03-18 DIAGNOSIS — Z954 Presence of other heart-valve replacement: Secondary | ICD-10-CM

## 2012-03-18 DIAGNOSIS — Z9889 Other specified postprocedural states: Secondary | ICD-10-CM

## 2012-03-18 DIAGNOSIS — I059 Rheumatic mitral valve disease, unspecified: Secondary | ICD-10-CM

## 2012-03-18 DIAGNOSIS — Z8679 Personal history of other diseases of the circulatory system: Secondary | ICD-10-CM

## 2012-03-18 DIAGNOSIS — Z7901 Long term (current) use of anticoagulants: Secondary | ICD-10-CM

## 2012-03-18 LAB — POCT INR: INR: 4.1

## 2012-03-25 ENCOUNTER — Ambulatory Visit: Payer: Self-pay | Admitting: *Deleted

## 2012-03-25 DIAGNOSIS — Z8679 Personal history of other diseases of the circulatory system: Secondary | ICD-10-CM

## 2012-03-25 DIAGNOSIS — Z7901 Long term (current) use of anticoagulants: Secondary | ICD-10-CM

## 2012-03-25 DIAGNOSIS — I059 Rheumatic mitral valve disease, unspecified: Secondary | ICD-10-CM

## 2012-03-25 DIAGNOSIS — I359 Nonrheumatic aortic valve disorder, unspecified: Secondary | ICD-10-CM

## 2012-03-25 DIAGNOSIS — Z9889 Other specified postprocedural states: Secondary | ICD-10-CM

## 2012-03-25 DIAGNOSIS — Z954 Presence of other heart-valve replacement: Secondary | ICD-10-CM

## 2012-04-09 ENCOUNTER — Ambulatory Visit (INDEPENDENT_AMBULATORY_CARE_PROVIDER_SITE_OTHER): Payer: Medicare Other | Admitting: *Deleted

## 2012-04-09 DIAGNOSIS — I359 Nonrheumatic aortic valve disorder, unspecified: Secondary | ICD-10-CM

## 2012-04-09 DIAGNOSIS — Z8679 Personal history of other diseases of the circulatory system: Secondary | ICD-10-CM

## 2012-04-09 DIAGNOSIS — Z7901 Long term (current) use of anticoagulants: Secondary | ICD-10-CM

## 2012-04-09 DIAGNOSIS — Z9889 Other specified postprocedural states: Secondary | ICD-10-CM

## 2012-04-09 DIAGNOSIS — Z954 Presence of other heart-valve replacement: Secondary | ICD-10-CM

## 2012-04-09 DIAGNOSIS — I6350 Cerebral infarction due to unspecified occlusion or stenosis of unspecified cerebral artery: Secondary | ICD-10-CM

## 2012-04-09 DIAGNOSIS — I059 Rheumatic mitral valve disease, unspecified: Secondary | ICD-10-CM

## 2012-04-23 ENCOUNTER — Ambulatory Visit (INDEPENDENT_AMBULATORY_CARE_PROVIDER_SITE_OTHER): Payer: Medicare Other | Admitting: *Deleted

## 2012-04-23 DIAGNOSIS — Z7901 Long term (current) use of anticoagulants: Secondary | ICD-10-CM

## 2012-04-23 DIAGNOSIS — Z9889 Other specified postprocedural states: Secondary | ICD-10-CM

## 2012-04-23 DIAGNOSIS — I359 Nonrheumatic aortic valve disorder, unspecified: Secondary | ICD-10-CM

## 2012-04-23 DIAGNOSIS — Z954 Presence of other heart-valve replacement: Secondary | ICD-10-CM

## 2012-04-23 DIAGNOSIS — I059 Rheumatic mitral valve disease, unspecified: Secondary | ICD-10-CM

## 2012-04-23 DIAGNOSIS — Z8679 Personal history of other diseases of the circulatory system: Secondary | ICD-10-CM

## 2012-04-23 DIAGNOSIS — I6350 Cerebral infarction due to unspecified occlusion or stenosis of unspecified cerebral artery: Secondary | ICD-10-CM

## 2012-05-07 ENCOUNTER — Ambulatory Visit (INDEPENDENT_AMBULATORY_CARE_PROVIDER_SITE_OTHER): Payer: Medicare Other | Admitting: *Deleted

## 2012-05-07 DIAGNOSIS — I359 Nonrheumatic aortic valve disorder, unspecified: Secondary | ICD-10-CM

## 2012-05-07 DIAGNOSIS — Z954 Presence of other heart-valve replacement: Secondary | ICD-10-CM

## 2012-05-07 DIAGNOSIS — Z8679 Personal history of other diseases of the circulatory system: Secondary | ICD-10-CM

## 2012-05-07 DIAGNOSIS — Z7901 Long term (current) use of anticoagulants: Secondary | ICD-10-CM

## 2012-05-07 DIAGNOSIS — Z9889 Other specified postprocedural states: Secondary | ICD-10-CM

## 2012-05-07 DIAGNOSIS — I6350 Cerebral infarction due to unspecified occlusion or stenosis of unspecified cerebral artery: Secondary | ICD-10-CM

## 2012-05-07 DIAGNOSIS — I059 Rheumatic mitral valve disease, unspecified: Secondary | ICD-10-CM

## 2012-05-21 ENCOUNTER — Ambulatory Visit (INDEPENDENT_AMBULATORY_CARE_PROVIDER_SITE_OTHER): Payer: Medicare Other | Admitting: *Deleted

## 2012-05-21 DIAGNOSIS — I059 Rheumatic mitral valve disease, unspecified: Secondary | ICD-10-CM

## 2012-05-21 DIAGNOSIS — I6350 Cerebral infarction due to unspecified occlusion or stenosis of unspecified cerebral artery: Secondary | ICD-10-CM

## 2012-05-21 DIAGNOSIS — Z954 Presence of other heart-valve replacement: Secondary | ICD-10-CM

## 2012-05-21 DIAGNOSIS — Z8679 Personal history of other diseases of the circulatory system: Secondary | ICD-10-CM

## 2012-05-21 DIAGNOSIS — Z9889 Other specified postprocedural states: Secondary | ICD-10-CM

## 2012-05-21 DIAGNOSIS — Z7901 Long term (current) use of anticoagulants: Secondary | ICD-10-CM

## 2012-05-21 DIAGNOSIS — I359 Nonrheumatic aortic valve disorder, unspecified: Secondary | ICD-10-CM

## 2012-05-21 LAB — POCT INR: INR: 4.4

## 2012-06-08 ENCOUNTER — Ambulatory Visit (INDEPENDENT_AMBULATORY_CARE_PROVIDER_SITE_OTHER): Payer: Medicare Other | Admitting: *Deleted

## 2012-06-08 DIAGNOSIS — Z7901 Long term (current) use of anticoagulants: Secondary | ICD-10-CM

## 2012-06-08 DIAGNOSIS — I359 Nonrheumatic aortic valve disorder, unspecified: Secondary | ICD-10-CM

## 2012-06-08 DIAGNOSIS — Z954 Presence of other heart-valve replacement: Secondary | ICD-10-CM

## 2012-06-08 DIAGNOSIS — Z9889 Other specified postprocedural states: Secondary | ICD-10-CM

## 2012-06-08 DIAGNOSIS — I059 Rheumatic mitral valve disease, unspecified: Secondary | ICD-10-CM

## 2012-06-08 DIAGNOSIS — I6350 Cerebral infarction due to unspecified occlusion or stenosis of unspecified cerebral artery: Secondary | ICD-10-CM

## 2012-06-08 DIAGNOSIS — Z8679 Personal history of other diseases of the circulatory system: Secondary | ICD-10-CM

## 2012-06-08 LAB — POCT INR: INR: 4.7

## 2012-06-26 IMAGING — CT CT ABD-PEL WO/W CM
3 of 11 series · 12 of 46 positions shown, 18 images · IV contrast (Omnipaque 300)
Comparison: None

CLINICAL DATA: Gross hematuria

CT ABDOMEN AND PELVIS WITHOUT AND WITH CONTRAST
TECHNIQUE: Multidetector CT imaging of the abdomen and pelvis was
performed without contrast material in one or both body regions,
followed by contrast material(s) and further sections in one or
both body regions. Breast shield utilized.
Contrast: 125 ml Gmnipaque-L55 IV; oral contrast not administered
for this indication

[Series 2: abd_pel_wo 5.0 b40f · axial · 0.69mm/px · z∈[-462,-402]mm · 2 of 94 slices shown]
[im 12/94  soft-tissue]
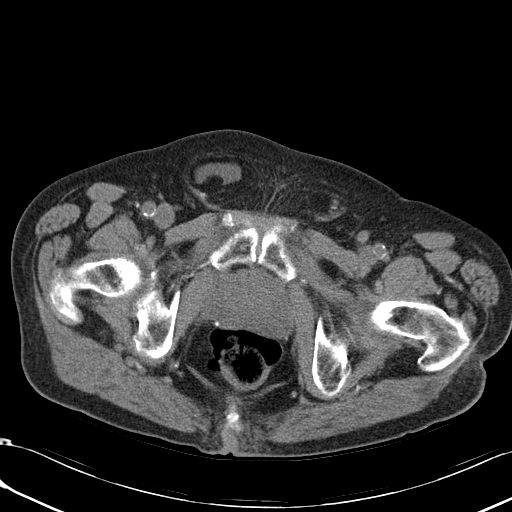
[im 24/94  soft-tissue]
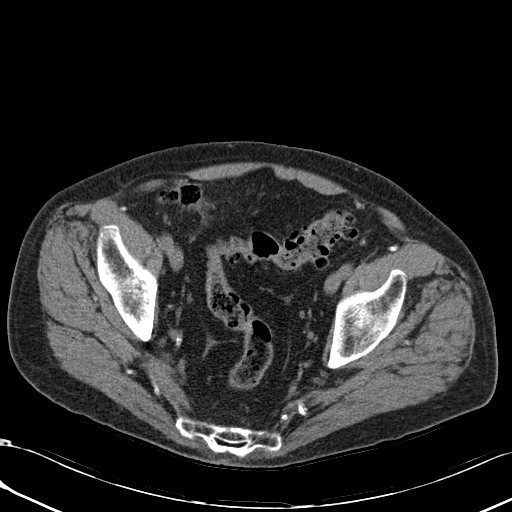

[Series 3: mpr pre contrast coronal 3.0 · coronal · non-contrast · 0.72mm/px · 2 of 97 slices shown, 3 images]
[im 33/97  soft-tissue]
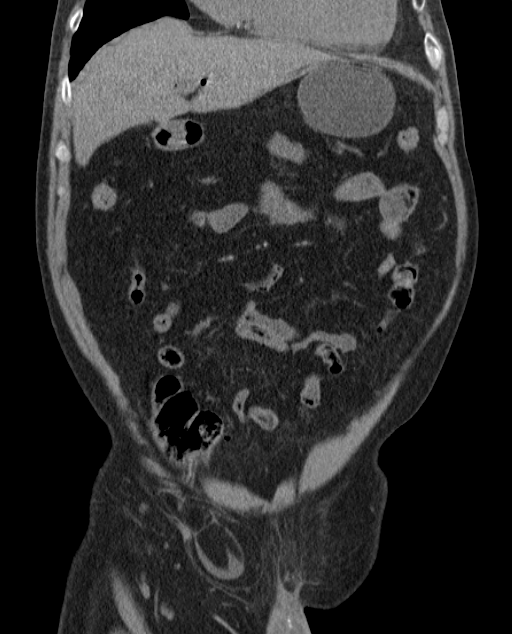
[im 33/97  bone]
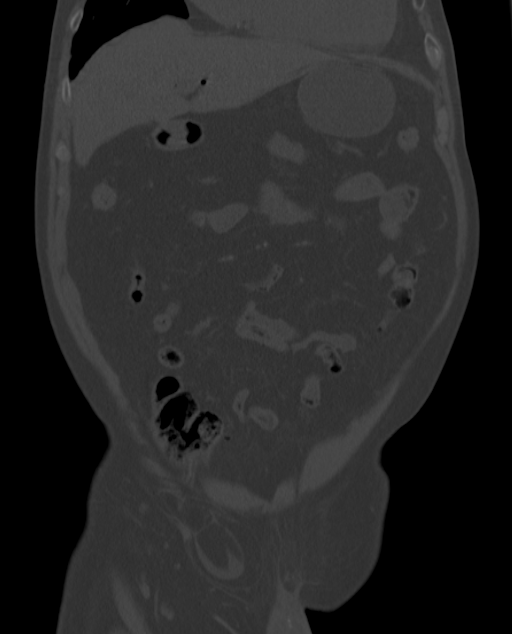
[im 65/97  soft-tissue]
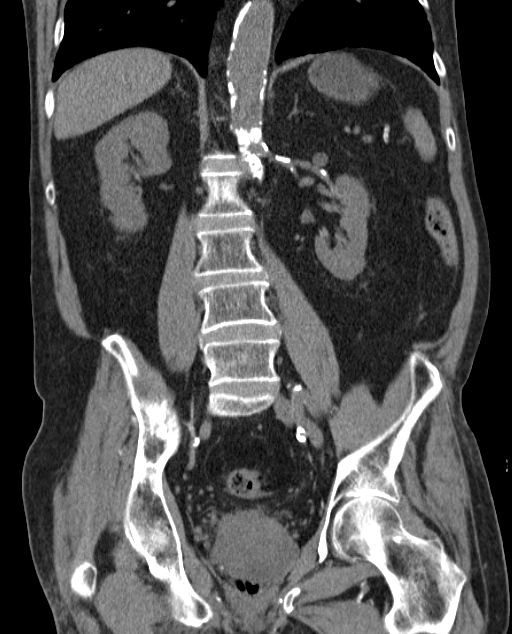

[Series 10: abd_pel 5.0 b40f · axial · 0.69mm/px · z∈[-472,-107]mm · 8 of 95 slices shown, 13 images]
[im 11/95  soft-tissue]
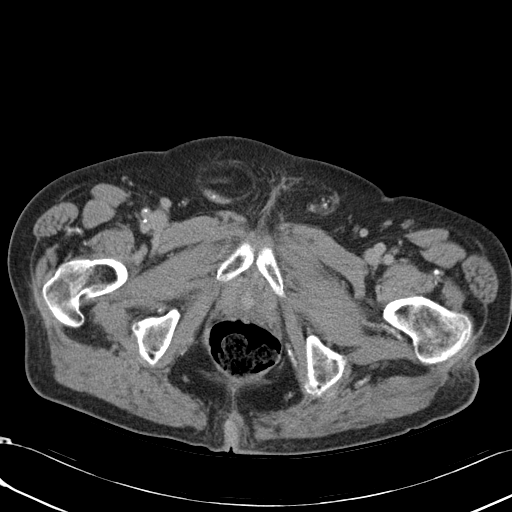
[im 11/95  bone]
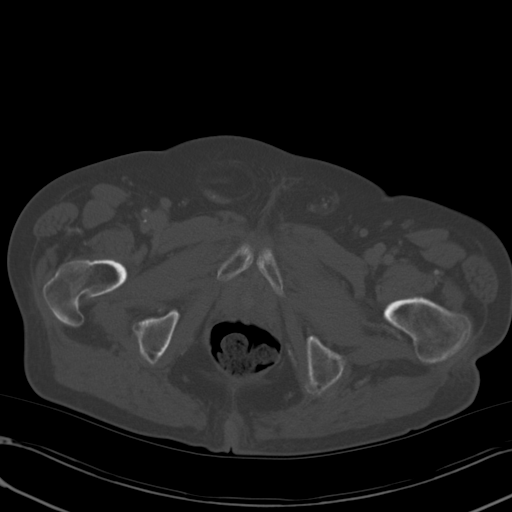
[im 21/95  soft-tissue]
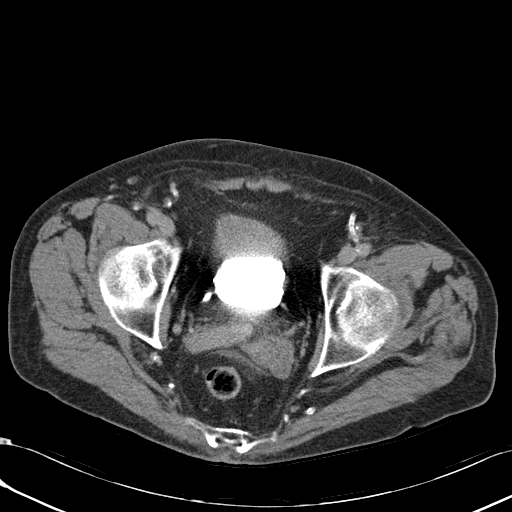
[im 32/95  soft-tissue]
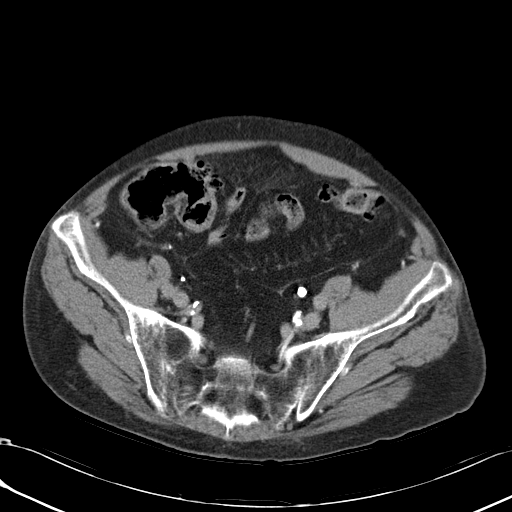
[im 42/95  soft-tissue]
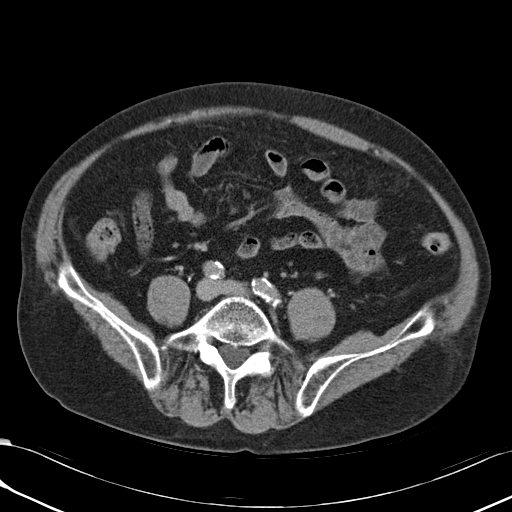
[im 53/95  soft-tissue]
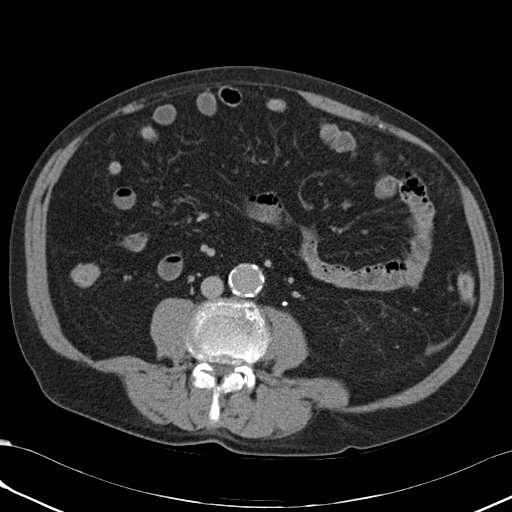
[im 53/95  lung]
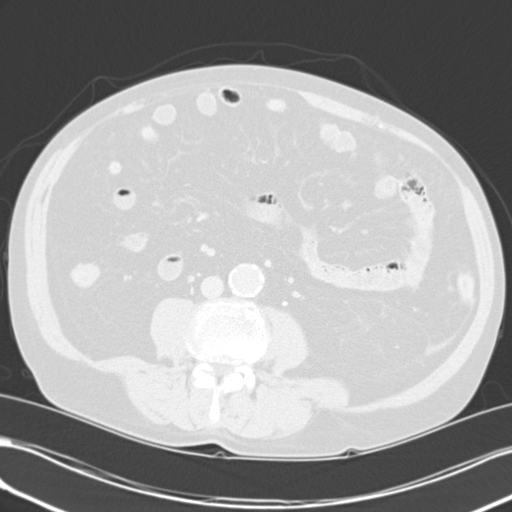
[im 63/95  soft-tissue]
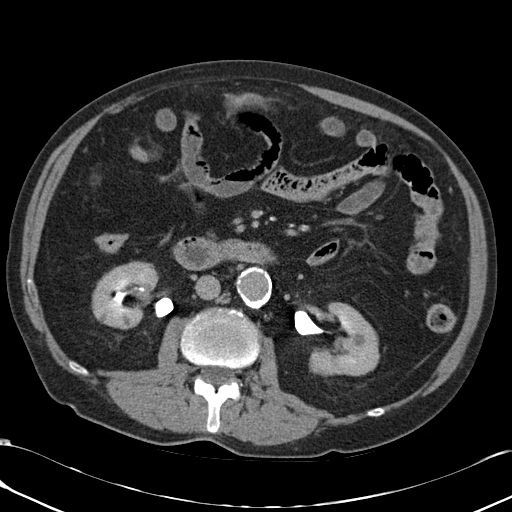
[im 63/95  lung]
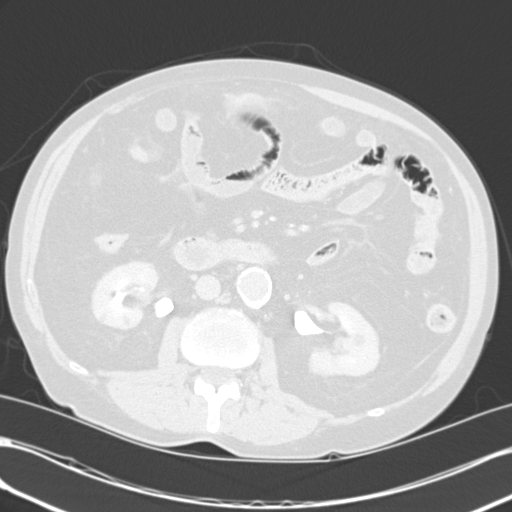
[im 74/95  soft-tissue]
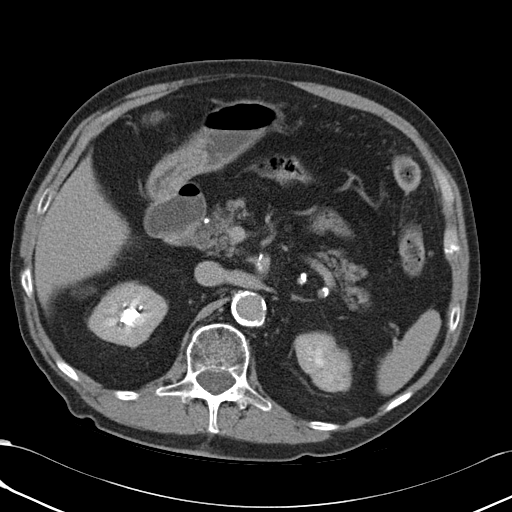
[im 74/95  lung]
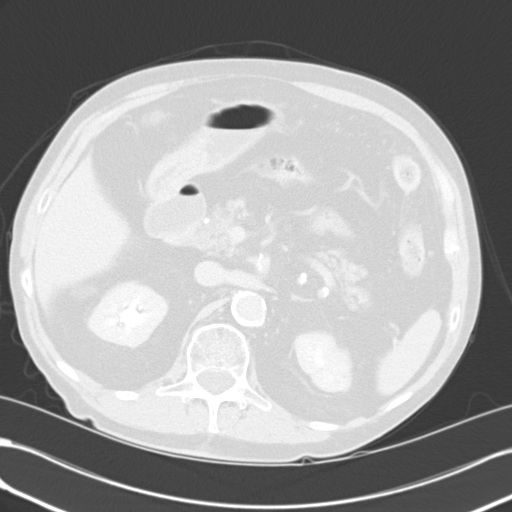
[im 84/95  soft-tissue]
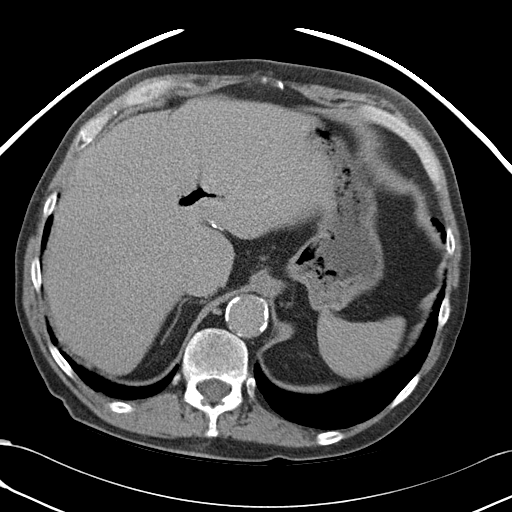
[im 84/95  lung]
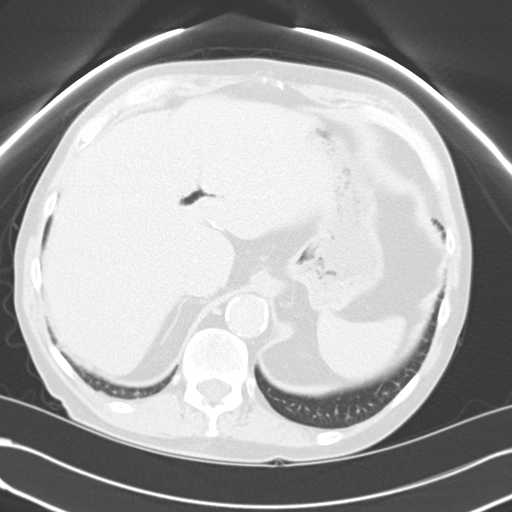

[12 of 46 positions shown; findings below may reference images not displayed]

FINDINGS: Minimal atelectasis or scarring at lung bases.
Tiny hiatal hernia.
Status post MVR with mild dilatation of the left ventricle with
myocardial thinning.
Heart appears enlarged.
Numerous bilateral nonobstructing renal calculi.
No hydronephrosis, ureteral calcification or ureteral dilatation.
Bladder unremarkable.
Multiple bilateral renal cysts, largest exophytic at upper pole of
the right kidney laterally, 2.5 x 1.8 cm image 24.
Prostatic enlargement, gland 6.5 x 4.5 cm axial image 79.

Pneumobilia, question prior ERCP with sphincterotomy.
Liver, spleen, pancreas, and adrenal glands otherwise normal
appearance.
Extensive atherosclerotic calcifications.
Distal abdominal aorta maximum size 2.8 x 2.7 cm series 2 image 39.
Tiny umbilical hernia containing fat.
Bilateral inguinal hernias, containing fat, with small amount of
fluid on right.
Minimal free pelvic fluid.
Sigmoid diverticulosis without evidence of diverticulitis.
Normal appendix.
Stomach and bowel loops otherwise normal.
No mass, adenopathy, or definite inflammatory process.
Bones appear demineralized.
Degenerative changes bilateral hip joints.
IMPRESSION: Bilateral renal cysts and nonobstructing calculi.
Prostatic enlargement.
No definite acute urinary tract abnormality.
Sigmoid diverticulosis.
Bilateral inguinal hernias with tiny umbilical and hiatal hernias.
Pneumobilia, question prior ERCP with sphincterotomy.
Extensive atherosclerotic disease.
Cardiac enlargement status post MVR.

## 2012-06-29 ENCOUNTER — Ambulatory Visit (INDEPENDENT_AMBULATORY_CARE_PROVIDER_SITE_OTHER): Payer: Medicare Other | Admitting: *Deleted

## 2012-06-29 DIAGNOSIS — I359 Nonrheumatic aortic valve disorder, unspecified: Secondary | ICD-10-CM

## 2012-06-29 DIAGNOSIS — Z9889 Other specified postprocedural states: Secondary | ICD-10-CM

## 2012-06-29 DIAGNOSIS — I059 Rheumatic mitral valve disease, unspecified: Secondary | ICD-10-CM

## 2012-06-29 DIAGNOSIS — I6350 Cerebral infarction due to unspecified occlusion or stenosis of unspecified cerebral artery: Secondary | ICD-10-CM

## 2012-06-29 DIAGNOSIS — Z954 Presence of other heart-valve replacement: Secondary | ICD-10-CM

## 2012-06-29 DIAGNOSIS — Z7901 Long term (current) use of anticoagulants: Secondary | ICD-10-CM

## 2012-06-29 DIAGNOSIS — Z8679 Personal history of other diseases of the circulatory system: Secondary | ICD-10-CM

## 2012-06-29 LAB — POCT INR: INR: 4.2

## 2012-07-23 ENCOUNTER — Ambulatory Visit (INDEPENDENT_AMBULATORY_CARE_PROVIDER_SITE_OTHER): Payer: Medicare Other | Admitting: *Deleted

## 2012-07-23 DIAGNOSIS — I059 Rheumatic mitral valve disease, unspecified: Secondary | ICD-10-CM

## 2012-07-23 DIAGNOSIS — Z9889 Other specified postprocedural states: Secondary | ICD-10-CM

## 2012-07-23 DIAGNOSIS — I6350 Cerebral infarction due to unspecified occlusion or stenosis of unspecified cerebral artery: Secondary | ICD-10-CM

## 2012-07-23 DIAGNOSIS — Z7901 Long term (current) use of anticoagulants: Secondary | ICD-10-CM

## 2012-07-23 DIAGNOSIS — Z8679 Personal history of other diseases of the circulatory system: Secondary | ICD-10-CM

## 2012-07-23 DIAGNOSIS — I359 Nonrheumatic aortic valve disorder, unspecified: Secondary | ICD-10-CM

## 2012-07-23 DIAGNOSIS — Z954 Presence of other heart-valve replacement: Secondary | ICD-10-CM

## 2012-07-23 LAB — POCT INR: INR: 4.6

## 2012-08-13 ENCOUNTER — Encounter: Payer: Self-pay | Admitting: Cardiology

## 2012-08-13 ENCOUNTER — Ambulatory Visit (INDEPENDENT_AMBULATORY_CARE_PROVIDER_SITE_OTHER): Payer: Medicare Other | Admitting: Cardiology

## 2012-08-13 ENCOUNTER — Ambulatory Visit (INDEPENDENT_AMBULATORY_CARE_PROVIDER_SITE_OTHER): Payer: Medicare Other | Admitting: *Deleted

## 2012-08-13 VITALS — BP 106/64 | HR 66 | Ht 72.0 in | Wt 160.0 lb

## 2012-08-13 DIAGNOSIS — R609 Edema, unspecified: Secondary | ICD-10-CM

## 2012-08-13 DIAGNOSIS — Z8679 Personal history of other diseases of the circulatory system: Secondary | ICD-10-CM

## 2012-08-13 DIAGNOSIS — L98499 Non-pressure chronic ulcer of skin of other sites with unspecified severity: Secondary | ICD-10-CM

## 2012-08-13 DIAGNOSIS — I519 Heart disease, unspecified: Secondary | ICD-10-CM

## 2012-08-13 DIAGNOSIS — I4821 Permanent atrial fibrillation: Secondary | ICD-10-CM

## 2012-08-13 DIAGNOSIS — I059 Rheumatic mitral valve disease, unspecified: Secondary | ICD-10-CM

## 2012-08-13 DIAGNOSIS — Z954 Presence of other heart-valve replacement: Secondary | ICD-10-CM

## 2012-08-13 DIAGNOSIS — Z7901 Long term (current) use of anticoagulants: Secondary | ICD-10-CM

## 2012-08-13 DIAGNOSIS — R0602 Shortness of breath: Secondary | ICD-10-CM

## 2012-08-13 DIAGNOSIS — I70209 Unspecified atherosclerosis of native arteries of extremities, unspecified extremity: Secondary | ICD-10-CM

## 2012-08-13 DIAGNOSIS — I6350 Cerebral infarction due to unspecified occlusion or stenosis of unspecified cerebral artery: Secondary | ICD-10-CM

## 2012-08-13 DIAGNOSIS — E785 Hyperlipidemia, unspecified: Secondary | ICD-10-CM

## 2012-08-13 DIAGNOSIS — Z9889 Other specified postprocedural states: Secondary | ICD-10-CM

## 2012-08-13 DIAGNOSIS — I509 Heart failure, unspecified: Secondary | ICD-10-CM

## 2012-08-13 DIAGNOSIS — I099 Rheumatic heart disease, unspecified: Secondary | ICD-10-CM

## 2012-08-13 DIAGNOSIS — I4891 Unspecified atrial fibrillation: Secondary | ICD-10-CM

## 2012-08-13 DIAGNOSIS — I669 Occlusion and stenosis of unspecified cerebral artery: Secondary | ICD-10-CM

## 2012-08-13 DIAGNOSIS — I359 Nonrheumatic aortic valve disorder, unspecified: Secondary | ICD-10-CM

## 2012-08-13 LAB — POCT INR: INR: 3.5

## 2012-08-13 MED ORDER — FUROSEMIDE 20 MG PO TABS: 20.0000 mg | ORAL_TABLET | Freq: Two times a day (BID) | ORAL | Status: DC

## 2012-08-13 NOTE — Patient Instructions (Addendum)
Your physician recommends that you schedule a follow-up appointment in: 3 months. Your physician has recommended you make the following change in your medication: Increased furosemide to twice daily. All other medications are still the same. Your physician recommends that you return for lab work in: TODAY AT [facility] FOR BMET/BNP. Your physician recommends that you return for lab work in: September 27th, 2013 for BMET/BNP/CBC/and lipids. Make sure you are fasting at least 8 hours for these test. Please have these done at [facility] lab. Your physician has requested that you have an echocardiogram. Echocardiography is a painless test that uses sound waves to create images of your heart. It provides your doctor with information about the size and shape of your heart and how well your heart's chambers and valves are working. This procedure takes approximately one hour. There are no restrictions for this procedure.

## 2012-08-15 NOTE — Progress Notes (Signed)
Patient ID: [name], male   DOB: 12-Oct-1932, 78 y.o.   MRN: [id number] PCP:  Dr. Ouida Sills  78 yo with history of rheumatic heart disease with mechanical aortic and mitral valves and history of cerebral embolism with INR around 3, necessitating an INR goal of 3.5-4.5, presents for followup.  He also has rheumatoid arthritis and severe PAD.  He has not had any overt bleeding.    He has chronic exertional dyspnea when he walks "a long way" on flat ground or climbs a flight of steps.  No chest pain.  Occasional episodes consistent with PND.  He is not on a statin due to myalgias.  He does not report significant leg pain though he has known severe PAD.  No foot ulcers.  Main limitation actually seems to be knee pain.   Labs (11/12): K 4.1, creatinine 0.9  PMH: 1. Rheumatoid arthritis: on prednisone. 2. Dementia 3. Rheumatic heart disease: St Jude AVR, Starr-Edwards MVR.  Echo (3/11) with mild LV dilation, EF 40-45%, mechanical MV with mean 5 mmHg, mechanical AoV mean gradient 9 mmHg, RV with normal systolic function, PA systolic pressure 37 mmHg.  4. Permanent atrial fibrillation 5. Cerebral embolic event with INR apparently around 3.  Current INR goal has been 3.5-4.5.   6. PAD: Severe distal disease, unlikely to be amenable to intervention.  Plan has been conservative treatment unless life-threatening limb ischemia.  7. Hyperlipidemia: Myalgias with multiple statins.   SH: Married, lives in [place], no smoking.   FH: No premature CAD.   ROS: All systems reviewed and negative except as per HPI.    Current Outpatient Prescriptions  Medication Sig Dispense Refill  . aspirin 81 MG tablet Take 81 mg by mouth daily.        . B Complex-C-Folic Acid (STRESS B COMPLEX PO) Take 1 tablet by mouth daily.        Marland Kitchen COUMADIN 5 MG tablet TAKE AS DIRECTED BY COUMADIN CLINIC  60 each  3  . donepezil (ARICEPT) 5 MG tablet Take 5 mg by mouth at bedtime.        . furosemide (LASIX) 20 MG tablet Take 1 tablet  (20 mg total) by mouth 2 (two) times daily.  60 tablet  3  . levothyroxine (SYNTHROID, LEVOTHROID) 125 MCG tablet Take 1 tablet by mouth Daily.      . metoprolol tartrate (LOPRESSOR) 25 MG tablet TAKE (1/2) TABLET BY MOUTH TWICE DAILY.  30 tablet  6  . nitroGLYCERIN (NITROSTAT) 0.4 MG SL tablet Place 0.4 mg under the tongue every 5 (five) minutes as needed.        . predniSONE (DELTASONE) 5 MG tablet Take 1 tablet by mouth Daily.      . ramipril (ALTACE) 5 MG capsule Take 1 capsule by mouth Daily.      Marland Kitchen terazosin (HYTRIN) 2 MG capsule Take 1 capsule by mouth Daily.      . traMADol-acetaminophen (ULTRACET) 37.5-325 MG per tablet Take 1 tablet by mouth 4 times daily.      Marland Kitchen triamcinolone (KENALOG) 0.1 % cream Apply 1 application topically 2 (two) times daily as needed.        BP 106/64  Pulse 66  Ht 6' (1.829 m)  Wt 160 lb (72.576 kg)  BMI 21.70 kg/m2  SpO2 96% General: NAD Neck: JVP 8-9 cm, no thyromegaly or thyroid nodule.  Lungs: Clear to auscultation bilaterally with normal respiratory effort. CV: Nondisplaced PMI.  Heart irregular, mechanical S1/S2, no S3/S4,  1/6 SEM.  1+ ankle edema bilaterally.  No carotid bruit.  Unable to palpate pedal pulses.  No foot ulcers.  Abdomen: Soft, nontender, no hepatosplenomegaly, no distention.  Neurologic: Alert and oriented x 3.  Psych: Normal affect. Extremities: No clubbing or cyanosis.   Assessment/Plan: 1. Mechanical aortic and mitral valves: INR goal has been 3.5-4.5 due to CVA with INR 3.  He has tolerated this INR goal for several years now with no bleeding problems.  Will continue.  He should continue ASA 81 mg daily.  2. Chronic systolic CHF: EF 19-14% in 2011.  He has some volume overload on exam with probably NYHA class III symptoms.  Continue ramipril.  Will continue metoprolol for now but eventually would like to switch over to carvedilol.  I will increase Lasix to 20 mg bid.  He will get a BMET/BNP today and repeat BMET/BNP in 2 wks.  I  will get an echocardiogram to reassess LV function and valves.  3. Atrial fibrillation: Chronic, good rate control.  Continue warfarin.  4. PAD: No significant leg pain though there is severe distal disease.  Plan for medical management unless life-threatening limb ischemia develops.  He cannot tolerate statins due to myalgias.    Areana Kosanke [employer]

## 2012-08-18 ENCOUNTER — Other Ambulatory Visit: Payer: Self-pay

## 2012-08-18 ENCOUNTER — Encounter: Payer: Self-pay | Admitting: Cardiology

## 2012-08-18 ENCOUNTER — Other Ambulatory Visit (INDEPENDENT_AMBULATORY_CARE_PROVIDER_SITE_OTHER): Payer: Medicare Other

## 2012-08-18 DIAGNOSIS — I099 Rheumatic heart disease, unspecified: Secondary | ICD-10-CM

## 2012-08-18 DIAGNOSIS — I509 Heart failure, unspecified: Secondary | ICD-10-CM

## 2012-08-18 DIAGNOSIS — I059 Rheumatic mitral valve disease, unspecified: Secondary | ICD-10-CM

## 2012-08-23 ENCOUNTER — Telehealth: Payer: Self-pay | Admitting: *Deleted

## 2012-08-23 NOTE — Telephone Encounter (Signed)
Message copied by Eustace Moore on Mon Aug 23, 2012  8:57 AM ------      Message from: Laurey Morale      Created: Sun Aug 22, 2012 10:37 PM       BNP mildly elevated, no changes.

## 2012-08-23 NOTE — Telephone Encounter (Signed)
Message copied by Eustace Moore on Mon Aug 23, 2012  8:54 AM ------      Message from: Laurey Morale      Created: Sun Aug 22, 2012 10:33 PM       Echo stable compared to prior.  EF 45%, mechanical valves look ok.

## 2012-08-23 NOTE — Telephone Encounter (Signed)
Patient's wife informed

## 2012-08-24 ENCOUNTER — Ambulatory Visit: Payer: Medicare Other | Admitting: Cardiology

## 2012-09-03 ENCOUNTER — Ambulatory Visit (INDEPENDENT_AMBULATORY_CARE_PROVIDER_SITE_OTHER): Payer: Medicare Other | Admitting: *Deleted

## 2012-09-03 DIAGNOSIS — I6350 Cerebral infarction due to unspecified occlusion or stenosis of unspecified cerebral artery: Secondary | ICD-10-CM

## 2012-09-03 DIAGNOSIS — I059 Rheumatic mitral valve disease, unspecified: Secondary | ICD-10-CM

## 2012-09-03 DIAGNOSIS — Z7901 Long term (current) use of anticoagulants: Secondary | ICD-10-CM

## 2012-09-03 DIAGNOSIS — I359 Nonrheumatic aortic valve disorder, unspecified: Secondary | ICD-10-CM

## 2012-09-03 DIAGNOSIS — Z8679 Personal history of other diseases of the circulatory system: Secondary | ICD-10-CM

## 2012-09-03 DIAGNOSIS — Z9889 Other specified postprocedural states: Secondary | ICD-10-CM

## 2012-09-03 DIAGNOSIS — Z954 Presence of other heart-valve replacement: Secondary | ICD-10-CM

## 2012-09-03 LAB — POCT INR: INR: 4.2

## 2012-09-24 ENCOUNTER — Ambulatory Visit (INDEPENDENT_AMBULATORY_CARE_PROVIDER_SITE_OTHER): Payer: Medicare Other | Admitting: *Deleted

## 2012-09-24 DIAGNOSIS — I359 Nonrheumatic aortic valve disorder, unspecified: Secondary | ICD-10-CM

## 2012-09-24 DIAGNOSIS — Z9889 Other specified postprocedural states: Secondary | ICD-10-CM

## 2012-09-24 DIAGNOSIS — Z8679 Personal history of other diseases of the circulatory system: Secondary | ICD-10-CM

## 2012-09-24 DIAGNOSIS — I059 Rheumatic mitral valve disease, unspecified: Secondary | ICD-10-CM

## 2012-09-24 DIAGNOSIS — I6350 Cerebral infarction due to unspecified occlusion or stenosis of unspecified cerebral artery: Secondary | ICD-10-CM

## 2012-09-24 DIAGNOSIS — Z954 Presence of other heart-valve replacement: Secondary | ICD-10-CM

## 2012-09-24 DIAGNOSIS — Z7901 Long term (current) use of anticoagulants: Secondary | ICD-10-CM

## 2012-10-15 ENCOUNTER — Ambulatory Visit (INDEPENDENT_AMBULATORY_CARE_PROVIDER_SITE_OTHER): Payer: Medicare Other | Admitting: *Deleted

## 2012-10-15 DIAGNOSIS — Z8679 Personal history of other diseases of the circulatory system: Secondary | ICD-10-CM

## 2012-10-15 DIAGNOSIS — Z9889 Other specified postprocedural states: Secondary | ICD-10-CM

## 2012-10-15 DIAGNOSIS — I359 Nonrheumatic aortic valve disorder, unspecified: Secondary | ICD-10-CM

## 2012-10-15 DIAGNOSIS — Z954 Presence of other heart-valve replacement: Secondary | ICD-10-CM

## 2012-10-15 DIAGNOSIS — I6350 Cerebral infarction due to unspecified occlusion or stenosis of unspecified cerebral artery: Secondary | ICD-10-CM

## 2012-10-15 DIAGNOSIS — Z7901 Long term (current) use of anticoagulants: Secondary | ICD-10-CM

## 2012-10-15 DIAGNOSIS — I059 Rheumatic mitral valve disease, unspecified: Secondary | ICD-10-CM

## 2012-10-15 LAB — POCT INR: INR: 6.2

## 2012-10-22 ENCOUNTER — Ambulatory Visit (INDEPENDENT_AMBULATORY_CARE_PROVIDER_SITE_OTHER): Payer: Medicare Other | Admitting: *Deleted

## 2012-10-22 DIAGNOSIS — Z954 Presence of other heart-valve replacement: Secondary | ICD-10-CM

## 2012-10-22 DIAGNOSIS — I059 Rheumatic mitral valve disease, unspecified: Secondary | ICD-10-CM

## 2012-10-22 DIAGNOSIS — I359 Nonrheumatic aortic valve disorder, unspecified: Secondary | ICD-10-CM

## 2012-10-22 DIAGNOSIS — I6350 Cerebral infarction due to unspecified occlusion or stenosis of unspecified cerebral artery: Secondary | ICD-10-CM

## 2012-10-22 DIAGNOSIS — Z7901 Long term (current) use of anticoagulants: Secondary | ICD-10-CM

## 2012-10-22 DIAGNOSIS — Z8679 Personal history of other diseases of the circulatory system: Secondary | ICD-10-CM

## 2012-10-22 DIAGNOSIS — Z9889 Other specified postprocedural states: Secondary | ICD-10-CM

## 2012-11-02 ENCOUNTER — Ambulatory Visit (INDEPENDENT_AMBULATORY_CARE_PROVIDER_SITE_OTHER): Payer: Medicare Other | Admitting: *Deleted

## 2012-11-02 DIAGNOSIS — I6350 Cerebral infarction due to unspecified occlusion or stenosis of unspecified cerebral artery: Secondary | ICD-10-CM

## 2012-11-02 DIAGNOSIS — I059 Rheumatic mitral valve disease, unspecified: Secondary | ICD-10-CM

## 2012-11-02 DIAGNOSIS — Z9889 Other specified postprocedural states: Secondary | ICD-10-CM

## 2012-11-02 DIAGNOSIS — Z954 Presence of other heart-valve replacement: Secondary | ICD-10-CM

## 2012-11-02 DIAGNOSIS — I359 Nonrheumatic aortic valve disorder, unspecified: Secondary | ICD-10-CM

## 2012-11-02 DIAGNOSIS — Z8679 Personal history of other diseases of the circulatory system: Secondary | ICD-10-CM

## 2012-11-02 DIAGNOSIS — Z7901 Long term (current) use of anticoagulants: Secondary | ICD-10-CM

## 2012-11-05 ENCOUNTER — Other Ambulatory Visit: Payer: Self-pay | Admitting: Internal Medicine

## 2012-11-09 ENCOUNTER — Ambulatory Visit (INDEPENDENT_AMBULATORY_CARE_PROVIDER_SITE_OTHER): Payer: Medicare Other | Admitting: Cardiology

## 2012-11-09 ENCOUNTER — Ambulatory Visit (INDEPENDENT_AMBULATORY_CARE_PROVIDER_SITE_OTHER): Payer: Medicare Other | Admitting: *Deleted

## 2012-11-09 ENCOUNTER — Encounter: Payer: Self-pay | Admitting: Cardiology

## 2012-11-09 VITALS — BP 128/64 | HR 60 | Ht 72.0 in | Wt 160.0 lb

## 2012-11-09 DIAGNOSIS — I059 Rheumatic mitral valve disease, unspecified: Secondary | ICD-10-CM

## 2012-11-09 DIAGNOSIS — Z954 Presence of other heart-valve replacement: Secondary | ICD-10-CM

## 2012-11-09 DIAGNOSIS — I099 Rheumatic heart disease, unspecified: Secondary | ICD-10-CM

## 2012-11-09 DIAGNOSIS — I359 Nonrheumatic aortic valve disorder, unspecified: Secondary | ICD-10-CM

## 2012-11-09 DIAGNOSIS — I4891 Unspecified atrial fibrillation: Secondary | ICD-10-CM

## 2012-11-09 DIAGNOSIS — I4821 Permanent atrial fibrillation: Secondary | ICD-10-CM

## 2012-11-09 DIAGNOSIS — R0602 Shortness of breath: Secondary | ICD-10-CM

## 2012-11-09 DIAGNOSIS — Z7901 Long term (current) use of anticoagulants: Secondary | ICD-10-CM

## 2012-11-09 DIAGNOSIS — I6350 Cerebral infarction due to unspecified occlusion or stenosis of unspecified cerebral artery: Secondary | ICD-10-CM

## 2012-11-09 DIAGNOSIS — Z8679 Personal history of other diseases of the circulatory system: Secondary | ICD-10-CM

## 2012-11-09 DIAGNOSIS — Z79899 Other long term (current) drug therapy: Secondary | ICD-10-CM

## 2012-11-09 DIAGNOSIS — Z9889 Other specified postprocedural states: Secondary | ICD-10-CM

## 2012-11-09 MED ORDER — CARVEDILOL 3.125 MG PO TABS: 3.1250 mg | ORAL_TABLET | Freq: Two times a day (BID) | ORAL | Status: DC

## 2012-11-09 MED ORDER — FUROSEMIDE 20 MG PO TABS: 20.0000 mg | ORAL_TABLET | Freq: Two times a day (BID) | ORAL | Status: DC

## 2012-11-09 NOTE — Patient Instructions (Signed)
   Stop Metoprolol  Begin Coreg 3.125mg  twice a day    Labs - do in 2 weeks for BMET, BNP  Office will contact with results  Continue all other current medications. Follow up in  4 months

## 2012-11-09 NOTE — Progress Notes (Signed)
Patient ID: [name], male   DOB: Apr 08, 1932, 78 y.o.   MRN: [id number] PCP:  Dr. Ouida Sills  78 yo with history of rheumatic heart disease with mechanical aortic and mitral valves and history of cerebral embolism with INR around 3, necessitating an INR goal of 3.5-4.5, presents for followup.  He also has rheumatoid arthritis and severe PAD.  He has not had any overt bleeding.    He has chronic exertional dyspnea when he walks "a long way" on flat ground or climbs a flight of steps.  No chest pain.  Breathing is better with increased Lasix.  No orthopnea or PND.  He is not on a statin due to myalgias.  He does not report significant leg pain though he has known severe PAD.  No foot ulcers.  Main limitation actually seems to be knee pain.  Continues to have difficulty with memory. Echo done in 9/13 showed EF 45% (stable) with normally functioning mechanical valves.   Labs (11/12): K 4.1, creatinine 0.9  PMH: 1. Rheumatoid arthritis: on prednisone. 2. Dementia 3. Rheumatic heart disease: St Jude AVR, Starr-Edwards MVR.  Echo (3/11) with mild LV dilation, EF 40-45%, mechanical MV with mean 5 mmHg, mechanical AoV mean gradient 9 mmHg, RV with normal systolic function, PA systolic pressure 37 mmHg.  Echo (9/13): EF 45%, moderate LVH, mildly dilated LV, St Jude mechanical aortic valve with mean gradient 8, Starr-Edwards mechanical mitral valve with mean gradient 4 mmHg, moderate to severe LAE, normal RV.  4. Permanent atrial fibrillation 5. Cerebral embolic event with INR apparently around 3.  Current INR goal has been 3.5-4.5.   6. PAD: Severe distal disease, unlikely to be amenable to intervention.  Plan has been conservative treatment unless life-threatening limb ischemia.  7. Hyperlipidemia: Myalgias with multiple statins.   SH: Married, lives in [place], no smoking.   FH: No premature CAD.   ROS: All systems reviewed and negative except as per HPI.    Current Outpatient Prescriptions   Medication Sig Dispense Refill  . aspirin 81 MG tablet Take 81 mg by mouth daily.        . B Complex-C-Folic Acid (STRESS B COMPLEX PO) Take 1 tablet by mouth daily.        Marland Kitchen COUMADIN 5 MG tablet TAKE AS DIRECTED BY COUMADIN CLINIC  60 each  3  . furosemide (LASIX) 20 MG tablet Take 1 tablet (20 mg total) by mouth 2 (two) times daily.  60 tablet  3  . HYDROcodone-acetaminophen (NORCO/VICODIN) 5-325 MG per tablet Take 1 tablet by mouth every 6 (six) hours as needed.      Marland Kitchen levothyroxine (SYNTHROID, LEVOTHROID) 125 MCG tablet Take 1 tablet by mouth Daily.      . metoprolol tartrate (LOPRESSOR) 25 MG tablet TAKE (1/2) TABLET BY MOUTH TWICE DAILY.  30 tablet  6  . Multiple Vitamin (MULTIVITAMIN) tablet Take 1 tablet by mouth daily.      . nitroGLYCERIN (NITROSTAT) 0.4 MG SL tablet Place 0.4 mg under the tongue every 5 (five) minutes as needed.        . potassium chloride SA (K-DUR,KLOR-CON) 20 MEQ tablet Take 20 mEq by mouth daily.      . predniSONE (DELTASONE) 5 MG tablet Take 1 tablet by mouth Daily.      . ramipril (ALTACE) 5 MG capsule Take 1 capsule by mouth Daily.      Marland Kitchen terazosin (HYTRIN) 2 MG capsule Take 1 capsule by mouth Daily.      Marland Kitchen  traMADol-acetaminophen (ULTRACET) 37.5-325 MG per tablet Take 1 tablet by mouth 3 (three) times daily.       Marland Kitchen triamcinolone (KENALOG) 0.1 % cream Apply 1 application topically 2 (two) times daily as needed.        BP 128/64  Pulse 60  Ht 6' (1.829 m)  Wt 160 lb (72.576 kg)  BMI 21.70 kg/m2 General: NAD Neck: JVP not elevated, no thyromegaly or thyroid nodule.  Lungs: Clear to auscultation bilaterally with normal respiratory effort. CV: Nondisplaced PMI.  Heart irregular, mechanical S1/S2, no S3/S4, 1/6 SEM.  1+ ankle edema bilaterally.  No carotid bruit.  Unable to palpate pedal pulses.  No foot ulcers.  Abdomen: Soft, nontender, no hepatosplenomegaly, no distention.  Neurologic: Alert and oriented x 3.  Psych: Normal affect. Extremities: No  clubbing or cyanosis.   Assessment/Plan: 1. Mechanical aortic and mitral valves: INR goal has been 3.5-4.5 due to CVA with INR 3.  He has tolerated this INR goal for several years now with no bleeding problems.  Will continue.  He should continue ASA 81 mg daily.  Valves were functioning normally on recent echo.  2. Chronic systolic CHF: EF 54% on recent echo which is stable compared to the past.  Symptoms improved with increased Lasix.   - Continue current ramipril and Lasix.   - BMET/BNP in 2 wks (recently increased KCl dosing.  - Stop metoprolol, will have him on Coreg 3.125 mg bid instead of metoprolol.  3. Atrial fibrillation: Chronic, good rate control.  Continue warfarin.  4. PAD: No significant leg pain though there is severe distal disease.  Plan for medical management unless life-threatening limb ischemia develops.  He cannot tolerate statins due to myalgias.    Marca Ancona 11/10/2012

## 2012-11-15 ENCOUNTER — Other Ambulatory Visit: Payer: Self-pay | Admitting: Cardiology

## 2012-11-26 ENCOUNTER — Ambulatory Visit (INDEPENDENT_AMBULATORY_CARE_PROVIDER_SITE_OTHER): Payer: Medicare Other | Admitting: *Deleted

## 2012-11-26 DIAGNOSIS — Z8679 Personal history of other diseases of the circulatory system: Secondary | ICD-10-CM

## 2012-11-26 DIAGNOSIS — Z9889 Other specified postprocedural states: Secondary | ICD-10-CM

## 2012-11-26 DIAGNOSIS — I6350 Cerebral infarction due to unspecified occlusion or stenosis of unspecified cerebral artery: Secondary | ICD-10-CM

## 2012-11-26 DIAGNOSIS — I059 Rheumatic mitral valve disease, unspecified: Secondary | ICD-10-CM

## 2012-11-26 DIAGNOSIS — I359 Nonrheumatic aortic valve disorder, unspecified: Secondary | ICD-10-CM

## 2012-11-26 DIAGNOSIS — Z954 Presence of other heart-valve replacement: Secondary | ICD-10-CM

## 2012-11-26 DIAGNOSIS — Z7901 Long term (current) use of anticoagulants: Secondary | ICD-10-CM

## 2012-12-02 ENCOUNTER — Encounter: Payer: Self-pay | Admitting: *Deleted

## 2012-12-02 ENCOUNTER — Telehealth: Payer: Self-pay | Admitting: Cardiology

## 2012-12-02 NOTE — Telephone Encounter (Signed)
Looks like he had been on KCl 20.  Would continue that.

## 2012-12-02 NOTE — Telephone Encounter (Signed)
Message copied by Burnice Logan on Thu Dec 02, 2012  2:13 PM ------      Message from: Laurey Morale      Created: Wed Dec 01, 2012 11:18 PM       Labs ok

## 2012-12-02 NOTE — Telephone Encounter (Signed)
Informed pt wife that he should resume KCL 20 per Dr. Shirlee Latch.

## 2012-12-02 NOTE — Telephone Encounter (Signed)
Patient wife called about labs. Informed that labs are ok. Pt has not take potassium in 3 days and his wife wants to make sure that it is ok that he stops taking medication altogether. Call back at home number to let her know if he can continue to not take it or if he has to start taking it again.

## 2012-12-08 ENCOUNTER — Other Ambulatory Visit: Payer: Self-pay | Admitting: Cardiology

## 2012-12-08 MED ORDER — WARFARIN SODIUM 5 MG PO TABS: 5.0000 mg | ORAL_TABLET | Freq: Every day | ORAL | Status: DC

## 2012-12-11 IMAGING — RF DG ERCP WO/W SPHINCTEROTOMY
1 series · 15 of 18 positions shown · non-contrast
Comparison: None.

CLINICAL DATA: Cholangitis.

ERCP
TECHNIQUE: Multiple spot images obtained with the fluoroscopic
device and submitted for interpretation post-procedure.  ERCP was
performed by Dr. Snodgrass.

[Series 1: run · 15 of 18 slices shown]
[im 1/18]
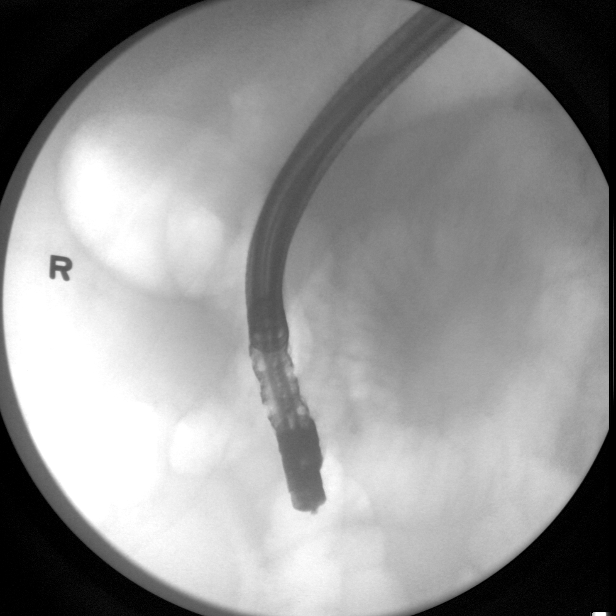
[im 2/18]
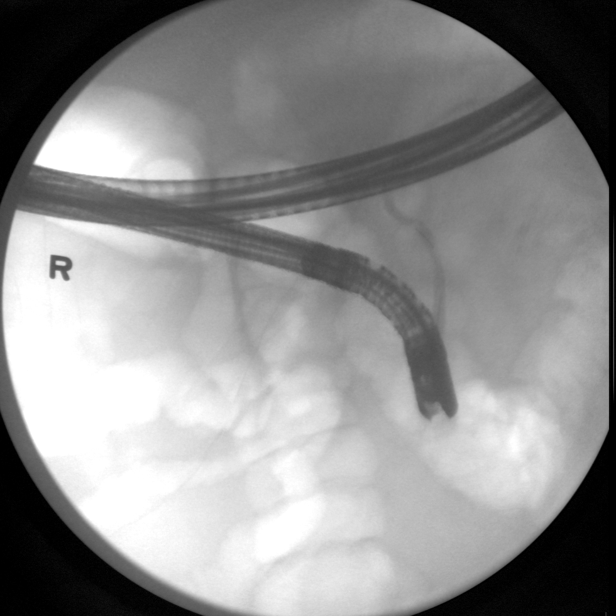
[im 4/18]
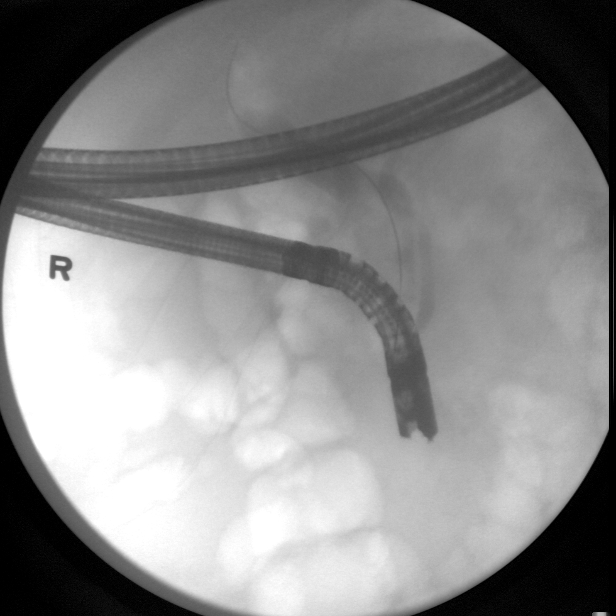
[im 5/18]
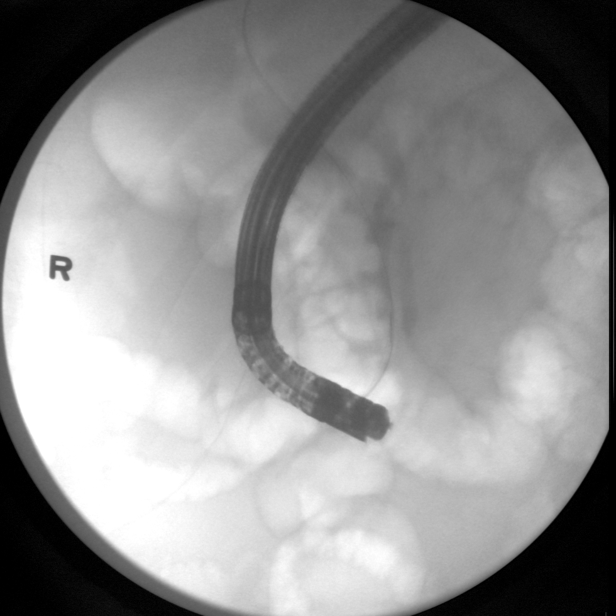
[im 6/18]
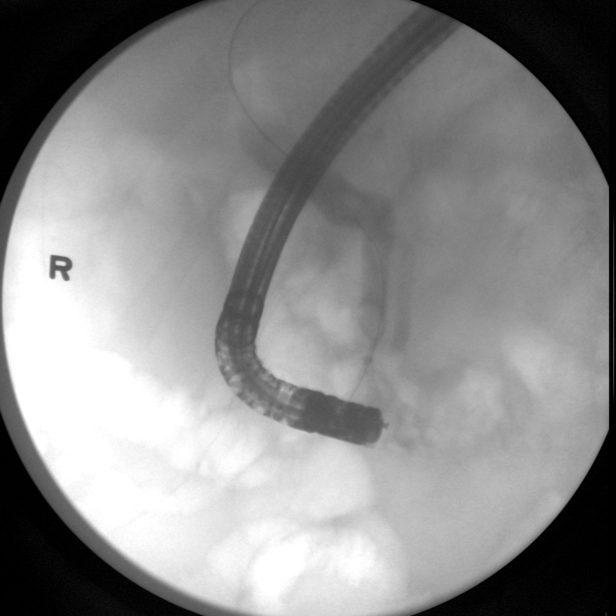
[im 7/18]
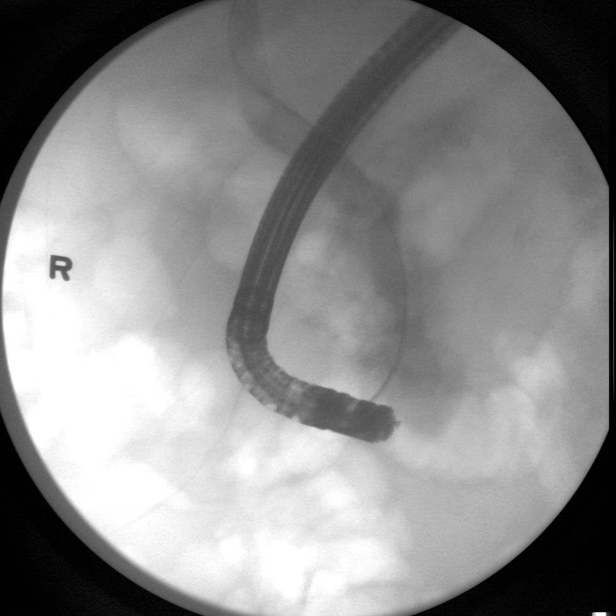
[im 8/18]
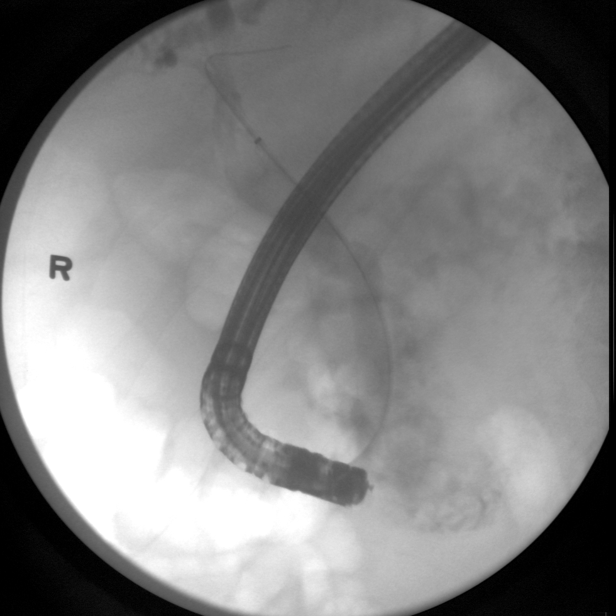
[im 10/18]
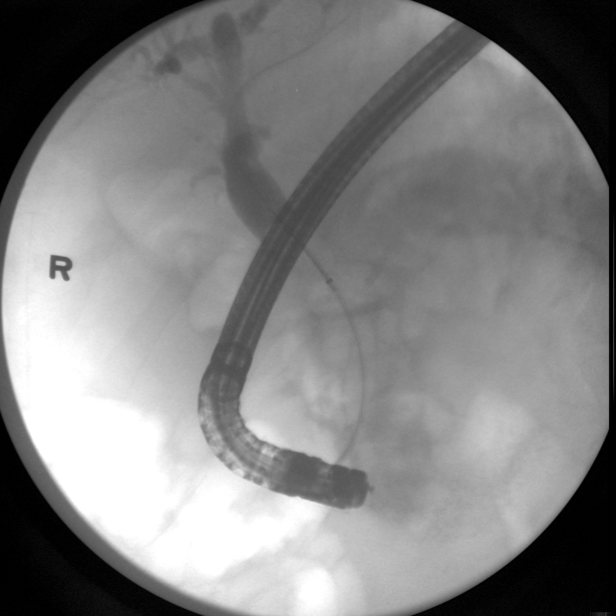
[im 11/18]
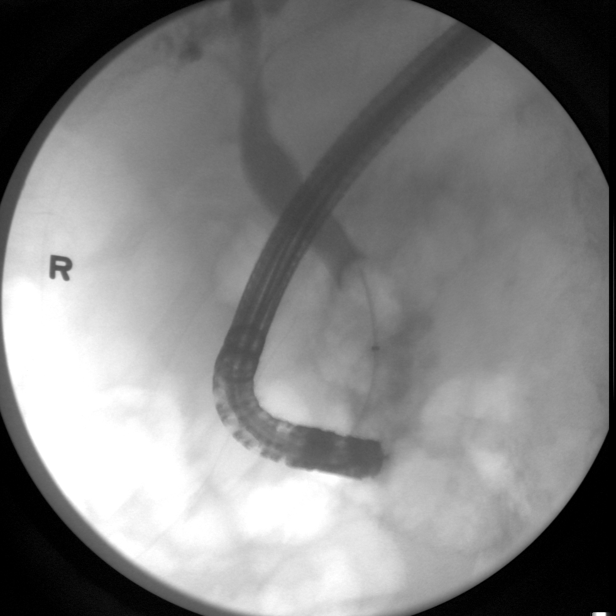
[im 12/18]
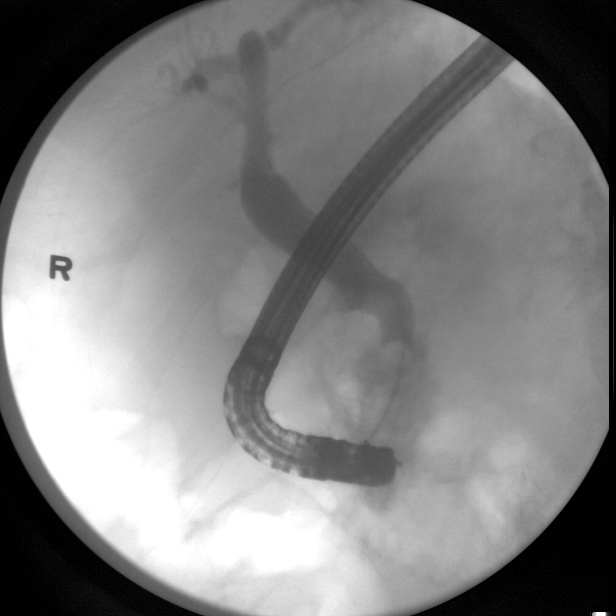
[im 13/18]
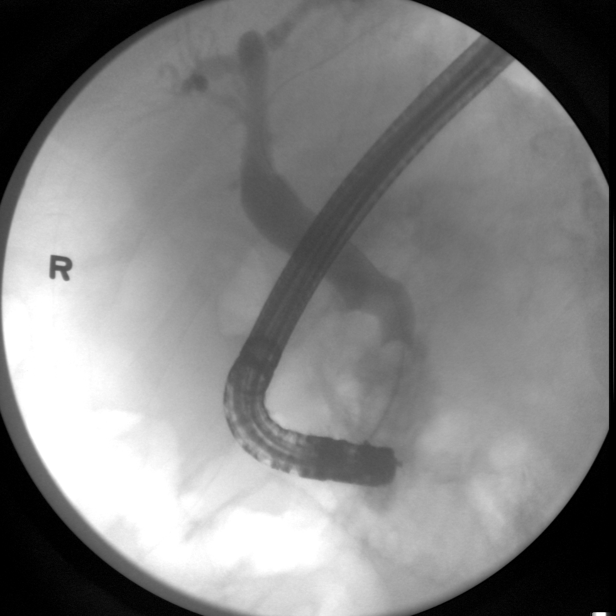
[im 14/18]
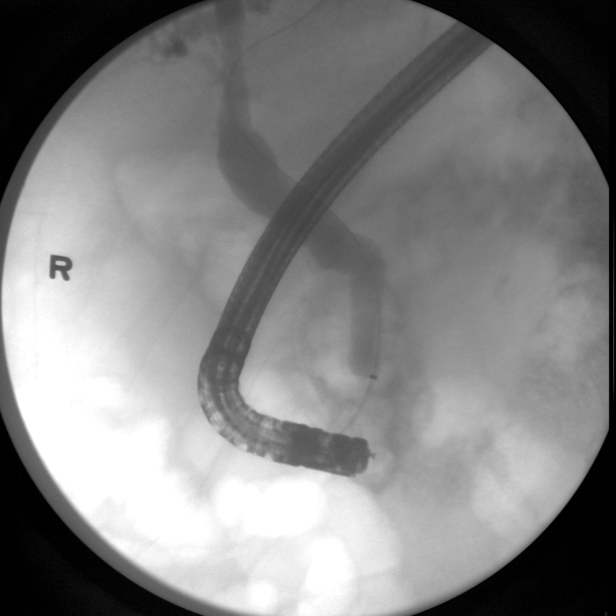
[im 16/18]
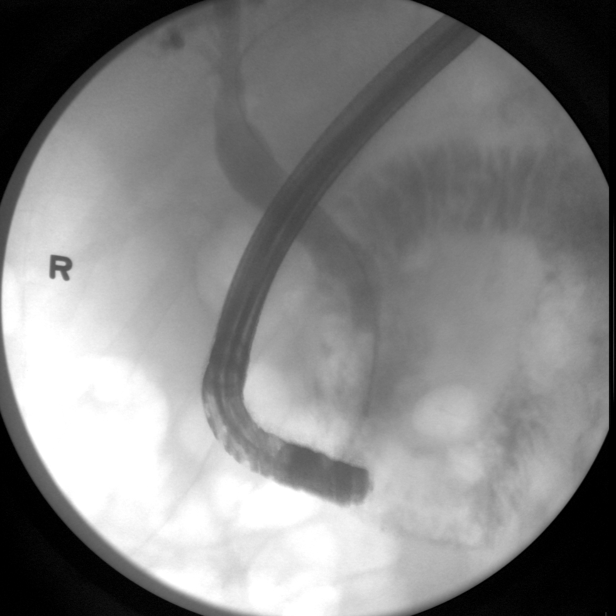
[im 17/18]
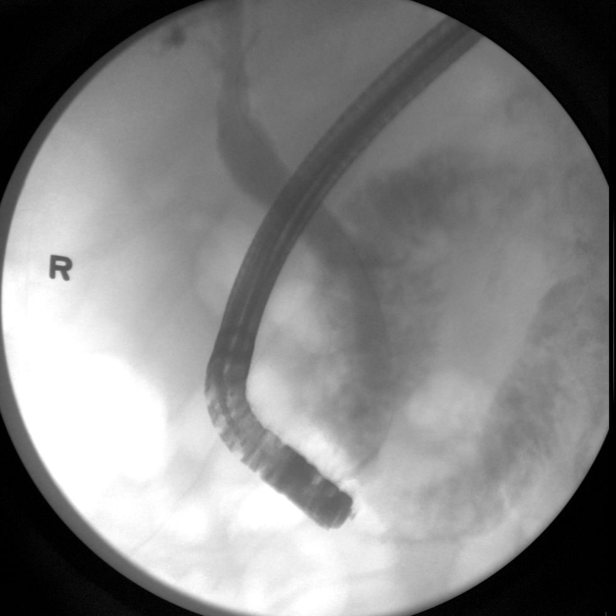
[im 18/18]
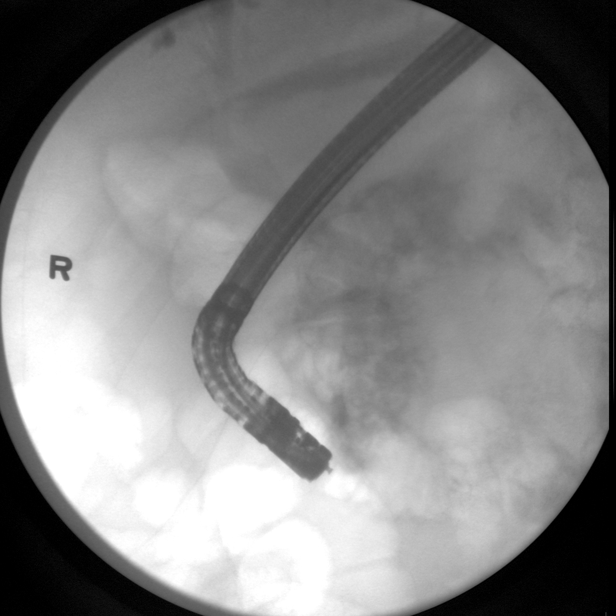

[15 of 18 positions shown; findings below may reference images not displayed]

FINDINGS: Images obtained demonstrate partial injection of the
pancreatic duct initially.  Cholangiogram shows dilated common bile
duct with suggestion initially of filling defects.  Balloon sweep
maneuver was performed.  Duct appears widely patent upon
completion.
IMPRESSION: Filling defects in the common bile duct.  These were eliminated
after balloon sweep maneuver.

These images were submitted for radiologic interpretation only.
Please see the procedural report for the amount of contrast and the
fluoroscopy time utilized.

## 2012-12-14 ENCOUNTER — Ambulatory Visit (INDEPENDENT_AMBULATORY_CARE_PROVIDER_SITE_OTHER): Payer: Medicare Other | Admitting: *Deleted

## 2012-12-14 DIAGNOSIS — Z8679 Personal history of other diseases of the circulatory system: Secondary | ICD-10-CM

## 2012-12-14 DIAGNOSIS — I359 Nonrheumatic aortic valve disorder, unspecified: Secondary | ICD-10-CM

## 2012-12-14 DIAGNOSIS — I6350 Cerebral infarction due to unspecified occlusion or stenosis of unspecified cerebral artery: Secondary | ICD-10-CM

## 2012-12-14 DIAGNOSIS — Z954 Presence of other heart-valve replacement: Secondary | ICD-10-CM

## 2012-12-14 DIAGNOSIS — I059 Rheumatic mitral valve disease, unspecified: Secondary | ICD-10-CM

## 2012-12-14 DIAGNOSIS — Z7901 Long term (current) use of anticoagulants: Secondary | ICD-10-CM

## 2012-12-14 DIAGNOSIS — Z9889 Other specified postprocedural states: Secondary | ICD-10-CM

## 2012-12-14 MED ORDER — WARFARIN SODIUM 5 MG PO TABS: ORAL_TABLET | ORAL | Status: DC

## 2012-12-31 ENCOUNTER — Ambulatory Visit (INDEPENDENT_AMBULATORY_CARE_PROVIDER_SITE_OTHER): Payer: Medicare Other | Admitting: *Deleted

## 2012-12-31 DIAGNOSIS — Z954 Presence of other heart-valve replacement: Secondary | ICD-10-CM

## 2012-12-31 DIAGNOSIS — I359 Nonrheumatic aortic valve disorder, unspecified: Secondary | ICD-10-CM

## 2012-12-31 DIAGNOSIS — I059 Rheumatic mitral valve disease, unspecified: Secondary | ICD-10-CM

## 2012-12-31 DIAGNOSIS — Z7901 Long term (current) use of anticoagulants: Secondary | ICD-10-CM

## 2012-12-31 DIAGNOSIS — Z8679 Personal history of other diseases of the circulatory system: Secondary | ICD-10-CM

## 2012-12-31 DIAGNOSIS — Z9889 Other specified postprocedural states: Secondary | ICD-10-CM

## 2012-12-31 DIAGNOSIS — I6350 Cerebral infarction due to unspecified occlusion or stenosis of unspecified cerebral artery: Secondary | ICD-10-CM

## 2013-01-15 ENCOUNTER — Other Ambulatory Visit: Payer: Self-pay

## 2013-01-18 ENCOUNTER — Ambulatory Visit (INDEPENDENT_AMBULATORY_CARE_PROVIDER_SITE_OTHER): Payer: Medicare Other | Admitting: *Deleted

## 2013-01-18 DIAGNOSIS — Z9889 Other specified postprocedural states: Secondary | ICD-10-CM

## 2013-01-18 DIAGNOSIS — I359 Nonrheumatic aortic valve disorder, unspecified: Secondary | ICD-10-CM

## 2013-01-18 DIAGNOSIS — Z954 Presence of other heart-valve replacement: Secondary | ICD-10-CM

## 2013-01-18 DIAGNOSIS — Z8679 Personal history of other diseases of the circulatory system: Secondary | ICD-10-CM

## 2013-01-18 DIAGNOSIS — I059 Rheumatic mitral valve disease, unspecified: Secondary | ICD-10-CM

## 2013-01-18 DIAGNOSIS — I6350 Cerebral infarction due to unspecified occlusion or stenosis of unspecified cerebral artery: Secondary | ICD-10-CM

## 2013-01-18 DIAGNOSIS — Z7901 Long term (current) use of anticoagulants: Secondary | ICD-10-CM

## 2013-01-18 LAB — POCT INR: INR: 3.3

## 2013-02-08 ENCOUNTER — Ambulatory Visit (INDEPENDENT_AMBULATORY_CARE_PROVIDER_SITE_OTHER): Payer: Medicare Other | Admitting: *Deleted

## 2013-02-08 DIAGNOSIS — Z8679 Personal history of other diseases of the circulatory system: Secondary | ICD-10-CM

## 2013-02-08 DIAGNOSIS — I359 Nonrheumatic aortic valve disorder, unspecified: Secondary | ICD-10-CM

## 2013-02-08 DIAGNOSIS — Z9889 Other specified postprocedural states: Secondary | ICD-10-CM

## 2013-02-08 DIAGNOSIS — Z954 Presence of other heart-valve replacement: Secondary | ICD-10-CM

## 2013-02-08 DIAGNOSIS — I059 Rheumatic mitral valve disease, unspecified: Secondary | ICD-10-CM

## 2013-02-08 DIAGNOSIS — Z7901 Long term (current) use of anticoagulants: Secondary | ICD-10-CM

## 2013-02-08 DIAGNOSIS — I6350 Cerebral infarction due to unspecified occlusion or stenosis of unspecified cerebral artery: Secondary | ICD-10-CM

## 2013-02-25 ENCOUNTER — Ambulatory Visit (INDEPENDENT_AMBULATORY_CARE_PROVIDER_SITE_OTHER): Payer: Medicare Other | Admitting: *Deleted

## 2013-02-25 DIAGNOSIS — I6350 Cerebral infarction due to unspecified occlusion or stenosis of unspecified cerebral artery: Secondary | ICD-10-CM

## 2013-02-25 DIAGNOSIS — Z8679 Personal history of other diseases of the circulatory system: Secondary | ICD-10-CM

## 2013-02-25 DIAGNOSIS — Z7901 Long term (current) use of anticoagulants: Secondary | ICD-10-CM

## 2013-02-25 DIAGNOSIS — Z9889 Other specified postprocedural states: Secondary | ICD-10-CM

## 2013-02-25 DIAGNOSIS — Z954 Presence of other heart-valve replacement: Secondary | ICD-10-CM

## 2013-02-25 DIAGNOSIS — I359 Nonrheumatic aortic valve disorder, unspecified: Secondary | ICD-10-CM

## 2013-02-25 DIAGNOSIS — I059 Rheumatic mitral valve disease, unspecified: Secondary | ICD-10-CM

## 2013-03-15 ENCOUNTER — Ambulatory Visit (INDEPENDENT_AMBULATORY_CARE_PROVIDER_SITE_OTHER): Payer: Medicare Other | Admitting: *Deleted

## 2013-03-15 DIAGNOSIS — Z8679 Personal history of other diseases of the circulatory system: Secondary | ICD-10-CM

## 2013-03-15 DIAGNOSIS — Z7901 Long term (current) use of anticoagulants: Secondary | ICD-10-CM

## 2013-03-15 DIAGNOSIS — I6350 Cerebral infarction due to unspecified occlusion or stenosis of unspecified cerebral artery: Secondary | ICD-10-CM

## 2013-03-15 DIAGNOSIS — Z9889 Other specified postprocedural states: Secondary | ICD-10-CM

## 2013-03-15 DIAGNOSIS — I359 Nonrheumatic aortic valve disorder, unspecified: Secondary | ICD-10-CM

## 2013-03-15 DIAGNOSIS — Z954 Presence of other heart-valve replacement: Secondary | ICD-10-CM

## 2013-03-15 DIAGNOSIS — I059 Rheumatic mitral valve disease, unspecified: Secondary | ICD-10-CM

## 2013-03-15 LAB — POCT INR: INR: 4.2

## 2013-04-06 ENCOUNTER — Ambulatory Visit (INDEPENDENT_AMBULATORY_CARE_PROVIDER_SITE_OTHER): Payer: Medicare Other | Admitting: Cardiology

## 2013-04-06 ENCOUNTER — Encounter: Payer: Self-pay | Admitting: Cardiology

## 2013-04-06 ENCOUNTER — Ambulatory Visit (INDEPENDENT_AMBULATORY_CARE_PROVIDER_SITE_OTHER): Payer: Medicare Other | Admitting: *Deleted

## 2013-04-06 VITALS — BP 137/75 | HR 80 | Ht 72.0 in | Wt 163.0 lb

## 2013-04-06 DIAGNOSIS — Z9889 Other specified postprocedural states: Secondary | ICD-10-CM

## 2013-04-06 DIAGNOSIS — I6350 Cerebral infarction due to unspecified occlusion or stenosis of unspecified cerebral artery: Secondary | ICD-10-CM

## 2013-04-06 DIAGNOSIS — I5032 Chronic diastolic (congestive) heart failure: Secondary | ICD-10-CM

## 2013-04-06 DIAGNOSIS — I4821 Permanent atrial fibrillation: Secondary | ICD-10-CM

## 2013-04-06 DIAGNOSIS — Z954 Presence of other heart-valve replacement: Secondary | ICD-10-CM

## 2013-04-06 DIAGNOSIS — Z8679 Personal history of other diseases of the circulatory system: Secondary | ICD-10-CM

## 2013-04-06 DIAGNOSIS — R0602 Shortness of breath: Secondary | ICD-10-CM

## 2013-04-06 DIAGNOSIS — Z7901 Long term (current) use of anticoagulants: Secondary | ICD-10-CM

## 2013-04-06 DIAGNOSIS — I059 Rheumatic mitral valve disease, unspecified: Secondary | ICD-10-CM

## 2013-04-06 DIAGNOSIS — I509 Heart failure, unspecified: Secondary | ICD-10-CM

## 2013-04-06 DIAGNOSIS — I4891 Unspecified atrial fibrillation: Secondary | ICD-10-CM

## 2013-04-06 DIAGNOSIS — I099 Rheumatic heart disease, unspecified: Secondary | ICD-10-CM

## 2013-04-06 DIAGNOSIS — I359 Nonrheumatic aortic valve disorder, unspecified: Secondary | ICD-10-CM

## 2013-04-06 MED ORDER — FUROSEMIDE 20 MG PO TABS: ORAL_TABLET | ORAL | Status: DC

## 2013-04-06 NOTE — Patient Instructions (Signed)
   Increase Lasix to 40mg  every morning & 20mg  every evening Continue all other current medications. Labs for BMET, BNP in 2 weeks Office will contact with results Your physician wants you to follow up in: 6 months.  You will receive a reminder letter in the mail one-two months in advance.  If you don't receive a letter, please call our office to schedule the follow up appointment

## 2013-04-07 LAB — POCT INR: INR: 4.4

## 2013-04-08 DIAGNOSIS — I5043 Acute on chronic combined systolic (congestive) and diastolic (congestive) heart failure: Secondary | ICD-10-CM | POA: Insufficient documentation

## 2013-04-08 NOTE — Progress Notes (Signed)
Patient ID: [name], male   DOB: August 05, 1932, 79 y.o.   MRN: [id number] PCP:  Dr. Ouida Sills  79 yo with history of rheumatic heart disease with mechanical aortic and mitral valves and history of cerebral embolism with INR around 3, necessitating an INR goal of 3.5-4.5, presents for followup.  He also has rheumatoid arthritis and severe PAD.  He has not had any overt bleeding.    He has chronic exertional dyspnea when he walks "a long way" on flat ground or climbs a flight of steps.  His wife thinks he is still carrying extra fluid.  Weight is up 3 lbs since last appointment.  No chest pain.  Breathing has been better with increased Lasix.  No orthopnea or PND.  He is not on a statin due to myalgias.  He does not report significant leg pain though he has known severe PAD.  No foot ulcers.  Main limitation actually seems to be knee pain.  Continues to have difficulty with memory. Echo done in 9/13 showed EF 45% (stable) with normally functioning mechanical valves.   Labs (11/12): K 4.1, creatinine 0.9 Labs (12/13); K 4.5, creatinine 0.85, BNP 173  PMH: 1. Rheumatoid arthritis: on prednisone. 2. Dementia 3. Rheumatic heart disease: St Jude AVR, Starr-Edwards MVR.  Echo (3/11) with mild LV dilation, EF 40-45%, mechanical MV with mean 5 mmHg, mechanical AoV mean gradient 9 mmHg, RV with normal systolic function, PA systolic pressure 37 mmHg.  Echo (9/13): EF 45%, moderate LVH, mildly dilated LV, St Jude mechanical aortic valve with mean gradient 8, Starr-Edwards mechanical mitral valve with mean gradient 4 mmHg, moderate to severe LAE, normal RV.  4. Permanent atrial fibrillation 5. Cerebral embolic event with INR apparently around 3.  Current INR goal has been 3.5-4.5.   6. PAD: Severe distal disease, unlikely to be amenable to intervention.  Plan has been conservative treatment unless life-threatening limb ischemia.  7. Hyperlipidemia: Myalgias with multiple statins.   SH: Married, lives in [place],  no smoking.   FH: No premature CAD.   ROS: All systems reviewed and negative except as per HPI.    Current Outpatient Prescriptions  Medication Sig Dispense Refill  . aspirin 81 MG tablet Take 81 mg by mouth daily.        . carvedilol (COREG) 3.125 MG tablet Take 1 tablet (3.125 mg total) by mouth 2 (two) times daily.  60 tablet  6  . furosemide (LASIX) 20 MG tablet Take 40mg  (2 tabs) every morning & 20mg  (1 tab) every evening  90 tablet  6  . levothyroxine (SYNTHROID, LEVOTHROID) 125 MCG tablet Take 1 tablet by mouth Daily.      . Multiple Vitamin (MULTIVITAMIN) tablet Take 1 tablet by mouth daily.      . potassium chloride SA (K-DUR,KLOR-CON) 20 MEQ tablet Take 20 mEq by mouth daily.      . predniSONE (DELTASONE) 5 MG tablet Take 1 tablet by mouth Daily.      . ramipril (ALTACE) 5 MG capsule Take 1 capsule by mouth Daily.      Marland Kitchen terazosin (HYTRIN) 2 MG capsule Take 1 capsule by mouth Daily.      . traMADol-acetaminophen (ULTRACET) 37.5-325 MG per tablet Take 1 tablet by mouth 3 (three) times daily.       Marland Kitchen triamcinolone (KENALOG) 0.1 % cream Apply 1 application topically 2 (two) times daily as needed.       . warfarin (COUMADIN) 5 MG tablet Take 1 tablet daily  or as directed  Use BRAND name coumadin only  60 tablet  3   No current facility-administered medications for this visit.   BP 137/75  Pulse 80  Ht 6' (1.829 m)  Wt 163 lb (73.936 kg)  BMI 22.1 kg/m2  SpO2 96% General: NAD Neck: JVP 8 cm, no thyromegaly or thyroid nodule.  Lungs: Clear to auscultation bilaterally with normal respiratory effort. CV: Nondisplaced PMI.  Heart irregular, mechanical S1/S2, no S3/S4, 1/6 SEM.  1+ ankle edema bilaterally.  No carotid bruit.  Unable to palpate pedal pulses.  No foot ulcers.  Abdomen: Soft, nontender, no hepatosplenomegaly, no distention.  Neurologic: Alert and oriented x 3.  Psych: Normal affect. Extremities: No clubbing or cyanosis.   Assessment/Plan: 1. Mechanical aortic and  mitral valves: INR goal has been 3.5-4.5 due to CVA with INR 3.  He has tolerated this INR goal for several years now with no bleeding problems.  Will continue.  He should continue ASA 81 mg daily.  Valves were functioning normally on recent echo.  2. Chronic systolic CHF: EF 16% on recent echo which is stable compared to the past.  Symptoms improved with increased Lasix but probably still some volume overload.    - Continue current ramipril and Coreg.   - Increase Lasix to 40 qam, 20 qpm.  - BMET/BNP in 2 wks.  3. Atrial fibrillation: Chronic, good rate control.  Continue warfarin.  4. PAD: No significant leg pain though there is severe distal disease.  Plan for medical management unless life-threatening limb ischemia develops.  He cannot tolerate statins due to myalgias.    Marca Ancona 04/08/2013

## 2013-04-22 ENCOUNTER — Ambulatory Visit (INDEPENDENT_AMBULATORY_CARE_PROVIDER_SITE_OTHER): Payer: Medicare Other | Admitting: *Deleted

## 2013-04-22 DIAGNOSIS — I359 Nonrheumatic aortic valve disorder, unspecified: Secondary | ICD-10-CM

## 2013-04-22 DIAGNOSIS — I059 Rheumatic mitral valve disease, unspecified: Secondary | ICD-10-CM

## 2013-04-22 DIAGNOSIS — Z8679 Personal history of other diseases of the circulatory system: Secondary | ICD-10-CM

## 2013-04-22 DIAGNOSIS — Z7901 Long term (current) use of anticoagulants: Secondary | ICD-10-CM

## 2013-04-22 DIAGNOSIS — Z954 Presence of other heart-valve replacement: Secondary | ICD-10-CM

## 2013-04-22 DIAGNOSIS — I6350 Cerebral infarction due to unspecified occlusion or stenosis of unspecified cerebral artery: Secondary | ICD-10-CM

## 2013-04-22 DIAGNOSIS — Z9889 Other specified postprocedural states: Secondary | ICD-10-CM

## 2013-04-22 LAB — POCT INR: INR: 5

## 2013-04-26 ENCOUNTER — Telehealth: Payer: Self-pay | Admitting: Cardiology

## 2013-04-26 NOTE — Telephone Encounter (Signed)
Wife [name]) notified of stable lab results per DM.  Also, advised rx was sent in on 5/7.  Stated they did deliver rx today.

## 2013-04-26 NOTE — Telephone Encounter (Signed)
[facility] - still does not have RX  furosemide (LASIX) 20 MG tablet  Wanting test results also

## 2013-05-06 ENCOUNTER — Ambulatory Visit (INDEPENDENT_AMBULATORY_CARE_PROVIDER_SITE_OTHER): Payer: Medicare Other | Admitting: *Deleted

## 2013-05-06 DIAGNOSIS — Z9889 Other specified postprocedural states: Secondary | ICD-10-CM

## 2013-05-06 DIAGNOSIS — Z7901 Long term (current) use of anticoagulants: Secondary | ICD-10-CM

## 2013-05-06 DIAGNOSIS — I359 Nonrheumatic aortic valve disorder, unspecified: Secondary | ICD-10-CM

## 2013-05-06 DIAGNOSIS — Z954 Presence of other heart-valve replacement: Secondary | ICD-10-CM

## 2013-05-06 DIAGNOSIS — I059 Rheumatic mitral valve disease, unspecified: Secondary | ICD-10-CM

## 2013-05-06 DIAGNOSIS — Z8679 Personal history of other diseases of the circulatory system: Secondary | ICD-10-CM

## 2013-05-06 DIAGNOSIS — I6350 Cerebral infarction due to unspecified occlusion or stenosis of unspecified cerebral artery: Secondary | ICD-10-CM

## 2013-05-17 ENCOUNTER — Ambulatory Visit (INDEPENDENT_AMBULATORY_CARE_PROVIDER_SITE_OTHER): Payer: Medicare Other | Admitting: *Deleted

## 2013-05-17 DIAGNOSIS — Z954 Presence of other heart-valve replacement: Secondary | ICD-10-CM

## 2013-05-17 DIAGNOSIS — Z7901 Long term (current) use of anticoagulants: Secondary | ICD-10-CM

## 2013-05-17 DIAGNOSIS — Z9889 Other specified postprocedural states: Secondary | ICD-10-CM

## 2013-05-17 DIAGNOSIS — I6350 Cerebral infarction due to unspecified occlusion or stenosis of unspecified cerebral artery: Secondary | ICD-10-CM

## 2013-05-17 DIAGNOSIS — I059 Rheumatic mitral valve disease, unspecified: Secondary | ICD-10-CM

## 2013-05-17 DIAGNOSIS — I359 Nonrheumatic aortic valve disorder, unspecified: Secondary | ICD-10-CM

## 2013-05-17 DIAGNOSIS — Z8679 Personal history of other diseases of the circulatory system: Secondary | ICD-10-CM

## 2013-05-17 LAB — POCT INR: INR: 4.9

## 2013-05-31 ENCOUNTER — Ambulatory Visit (INDEPENDENT_AMBULATORY_CARE_PROVIDER_SITE_OTHER): Payer: Medicare Other | Admitting: *Deleted

## 2013-05-31 DIAGNOSIS — Z8679 Personal history of other diseases of the circulatory system: Secondary | ICD-10-CM

## 2013-05-31 DIAGNOSIS — I059 Rheumatic mitral valve disease, unspecified: Secondary | ICD-10-CM

## 2013-05-31 DIAGNOSIS — Z7901 Long term (current) use of anticoagulants: Secondary | ICD-10-CM

## 2013-05-31 DIAGNOSIS — Z9889 Other specified postprocedural states: Secondary | ICD-10-CM

## 2013-05-31 DIAGNOSIS — I6350 Cerebral infarction due to unspecified occlusion or stenosis of unspecified cerebral artery: Secondary | ICD-10-CM

## 2013-05-31 DIAGNOSIS — I359 Nonrheumatic aortic valve disorder, unspecified: Secondary | ICD-10-CM

## 2013-05-31 DIAGNOSIS — Z954 Presence of other heart-valve replacement: Secondary | ICD-10-CM

## 2013-05-31 LAB — POCT INR: INR: 4.3

## 2013-06-17 ENCOUNTER — Ambulatory Visit (INDEPENDENT_AMBULATORY_CARE_PROVIDER_SITE_OTHER): Payer: Medicare Other | Admitting: *Deleted

## 2013-06-17 DIAGNOSIS — I6350 Cerebral infarction due to unspecified occlusion or stenosis of unspecified cerebral artery: Secondary | ICD-10-CM

## 2013-06-17 DIAGNOSIS — I359 Nonrheumatic aortic valve disorder, unspecified: Secondary | ICD-10-CM

## 2013-06-17 DIAGNOSIS — Z8679 Personal history of other diseases of the circulatory system: Secondary | ICD-10-CM

## 2013-06-17 DIAGNOSIS — Z9889 Other specified postprocedural states: Secondary | ICD-10-CM

## 2013-06-17 DIAGNOSIS — Z954 Presence of other heart-valve replacement: Secondary | ICD-10-CM

## 2013-06-17 DIAGNOSIS — I059 Rheumatic mitral valve disease, unspecified: Secondary | ICD-10-CM

## 2013-06-17 DIAGNOSIS — Z7901 Long term (current) use of anticoagulants: Secondary | ICD-10-CM

## 2013-06-30 ENCOUNTER — Other Ambulatory Visit: Payer: Self-pay | Admitting: Cardiology

## 2013-07-06 ENCOUNTER — Other Ambulatory Visit: Payer: Self-pay

## 2013-07-08 ENCOUNTER — Ambulatory Visit (INDEPENDENT_AMBULATORY_CARE_PROVIDER_SITE_OTHER): Payer: Medicare Other | Admitting: *Deleted

## 2013-07-08 DIAGNOSIS — I6350 Cerebral infarction due to unspecified occlusion or stenosis of unspecified cerebral artery: Secondary | ICD-10-CM

## 2013-07-08 DIAGNOSIS — Z7901 Long term (current) use of anticoagulants: Secondary | ICD-10-CM

## 2013-07-08 DIAGNOSIS — Z954 Presence of other heart-valve replacement: Secondary | ICD-10-CM

## 2013-07-08 DIAGNOSIS — Z8679 Personal history of other diseases of the circulatory system: Secondary | ICD-10-CM

## 2013-07-08 DIAGNOSIS — Z9889 Other specified postprocedural states: Secondary | ICD-10-CM

## 2013-07-08 DIAGNOSIS — I359 Nonrheumatic aortic valve disorder, unspecified: Secondary | ICD-10-CM

## 2013-07-08 DIAGNOSIS — I059 Rheumatic mitral valve disease, unspecified: Secondary | ICD-10-CM

## 2013-07-08 LAB — POCT INR: INR: 4.5

## 2013-08-02 ENCOUNTER — Ambulatory Visit (INDEPENDENT_AMBULATORY_CARE_PROVIDER_SITE_OTHER): Payer: Medicare Other | Admitting: *Deleted

## 2013-08-02 DIAGNOSIS — I6350 Cerebral infarction due to unspecified occlusion or stenosis of unspecified cerebral artery: Secondary | ICD-10-CM

## 2013-08-02 DIAGNOSIS — Z9889 Other specified postprocedural states: Secondary | ICD-10-CM

## 2013-08-02 DIAGNOSIS — Z8679 Personal history of other diseases of the circulatory system: Secondary | ICD-10-CM

## 2013-08-02 DIAGNOSIS — I359 Nonrheumatic aortic valve disorder, unspecified: Secondary | ICD-10-CM

## 2013-08-02 DIAGNOSIS — Z7901 Long term (current) use of anticoagulants: Secondary | ICD-10-CM

## 2013-08-02 DIAGNOSIS — Z954 Presence of other heart-valve replacement: Secondary | ICD-10-CM

## 2013-08-02 DIAGNOSIS — I059 Rheumatic mitral valve disease, unspecified: Secondary | ICD-10-CM

## 2013-08-02 LAB — POCT INR: INR: 3.1

## 2013-08-19 ENCOUNTER — Ambulatory Visit (INDEPENDENT_AMBULATORY_CARE_PROVIDER_SITE_OTHER): Payer: Medicare Other | Admitting: *Deleted

## 2013-08-19 DIAGNOSIS — I6350 Cerebral infarction due to unspecified occlusion or stenosis of unspecified cerebral artery: Secondary | ICD-10-CM

## 2013-08-19 DIAGNOSIS — I359 Nonrheumatic aortic valve disorder, unspecified: Secondary | ICD-10-CM

## 2013-08-19 DIAGNOSIS — Z9889 Other specified postprocedural states: Secondary | ICD-10-CM

## 2013-08-19 DIAGNOSIS — Z7901 Long term (current) use of anticoagulants: Secondary | ICD-10-CM

## 2013-08-19 DIAGNOSIS — I059 Rheumatic mitral valve disease, unspecified: Secondary | ICD-10-CM

## 2013-08-19 DIAGNOSIS — Z8679 Personal history of other diseases of the circulatory system: Secondary | ICD-10-CM

## 2013-08-19 DIAGNOSIS — Z954 Presence of other heart-valve replacement: Secondary | ICD-10-CM

## 2013-08-30 ENCOUNTER — Encounter: Payer: Self-pay | Admitting: Cardiovascular Disease

## 2013-09-09 ENCOUNTER — Ambulatory Visit (INDEPENDENT_AMBULATORY_CARE_PROVIDER_SITE_OTHER): Payer: Medicare Other | Admitting: *Deleted

## 2013-09-09 DIAGNOSIS — Z954 Presence of other heart-valve replacement: Secondary | ICD-10-CM

## 2013-09-09 DIAGNOSIS — I059 Rheumatic mitral valve disease, unspecified: Secondary | ICD-10-CM

## 2013-09-09 DIAGNOSIS — Z8679 Personal history of other diseases of the circulatory system: Secondary | ICD-10-CM

## 2013-09-09 DIAGNOSIS — I359 Nonrheumatic aortic valve disorder, unspecified: Secondary | ICD-10-CM

## 2013-09-09 DIAGNOSIS — Z7901 Long term (current) use of anticoagulants: Secondary | ICD-10-CM

## 2013-09-09 DIAGNOSIS — I6350 Cerebral infarction due to unspecified occlusion or stenosis of unspecified cerebral artery: Secondary | ICD-10-CM

## 2013-09-09 DIAGNOSIS — Z9889 Other specified postprocedural states: Secondary | ICD-10-CM

## 2013-09-28 ENCOUNTER — Other Ambulatory Visit: Payer: Self-pay | Admitting: Cardiology

## 2013-09-30 ENCOUNTER — Ambulatory Visit (INDEPENDENT_AMBULATORY_CARE_PROVIDER_SITE_OTHER): Payer: Medicare Other | Admitting: *Deleted

## 2013-09-30 DIAGNOSIS — I6350 Cerebral infarction due to unspecified occlusion or stenosis of unspecified cerebral artery: Secondary | ICD-10-CM

## 2013-09-30 DIAGNOSIS — I059 Rheumatic mitral valve disease, unspecified: Secondary | ICD-10-CM

## 2013-09-30 DIAGNOSIS — Z9889 Other specified postprocedural states: Secondary | ICD-10-CM

## 2013-09-30 DIAGNOSIS — Z954 Presence of other heart-valve replacement: Secondary | ICD-10-CM

## 2013-09-30 DIAGNOSIS — Z8679 Personal history of other diseases of the circulatory system: Secondary | ICD-10-CM

## 2013-09-30 DIAGNOSIS — Z7901 Long term (current) use of anticoagulants: Secondary | ICD-10-CM

## 2013-09-30 DIAGNOSIS — I359 Nonrheumatic aortic valve disorder, unspecified: Secondary | ICD-10-CM

## 2013-10-06 ENCOUNTER — Other Ambulatory Visit: Payer: Self-pay

## 2013-10-07 IMAGING — CT CT HEAD W/O CM
1 series · 15 of 30 positions shown, 19 images · non-contrast
Comparison: None

CLINICAL DATA: Memory loss.  Hypertension.  History of stroke.

CT HEAD WITHOUT CONTRAST
TECHNIQUE: Contiguous axial images were obtained from the base of
the skull through the vertex without contrast.

[Series 2: headseq 4.8 h37s · axial · 0.48mm/px · z∈[+86,+243]mm · 15 of 36 slices shown, 19 images]
[im 2/36  brain]
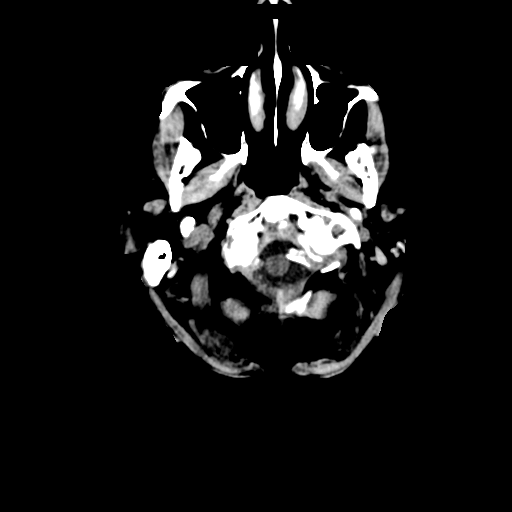
[im 2/36  bone]
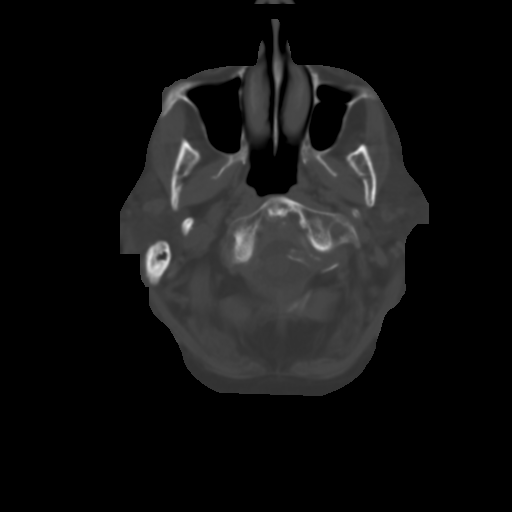
[im 4/36  brain]
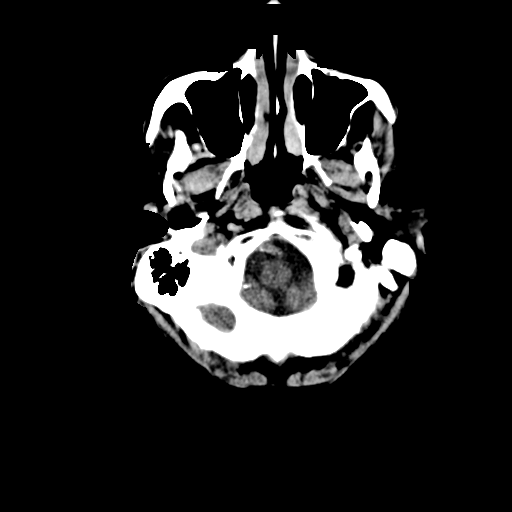
[im 7/36  brain]
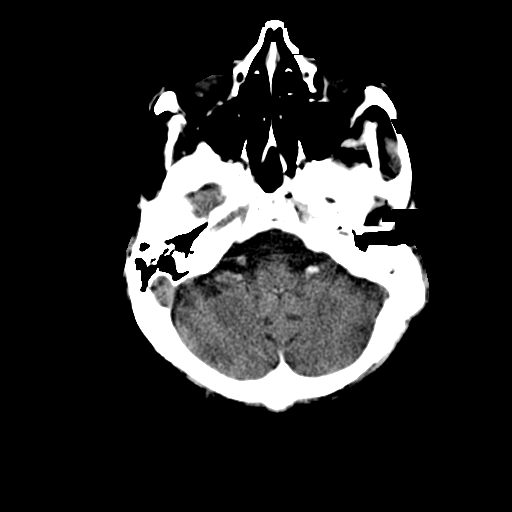
[im 9/36  brain]
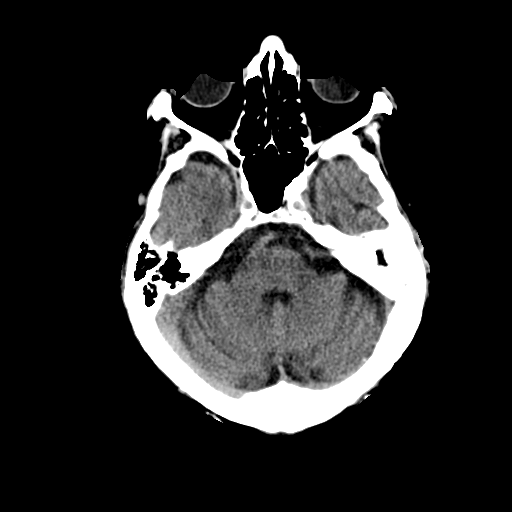
[im 11/36  brain]
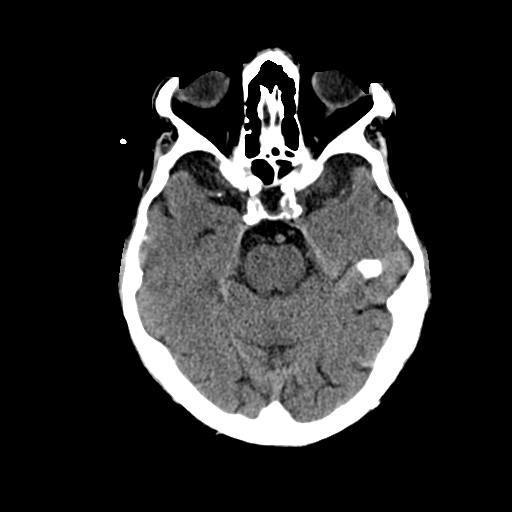
[im 11/36  bone]
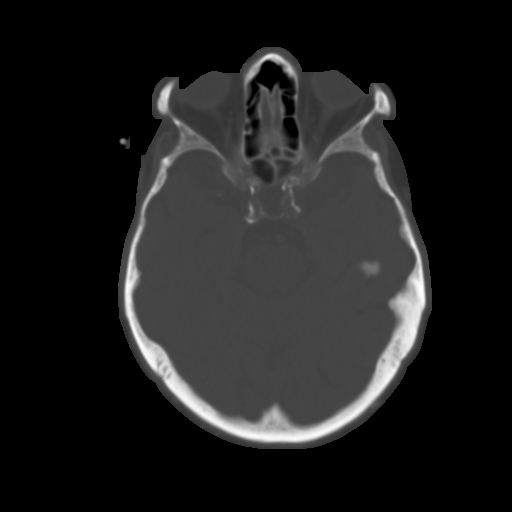
[im 14/36  brain]
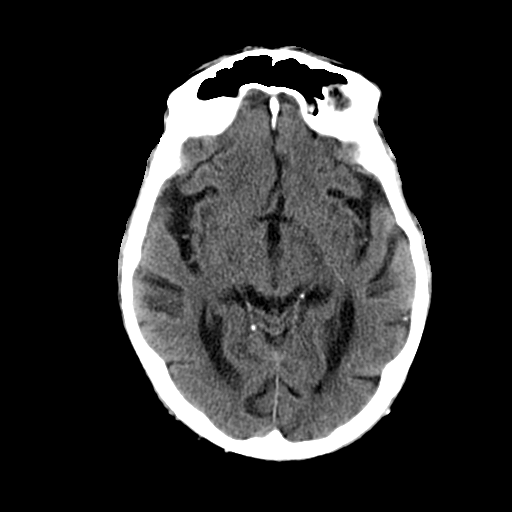
[im 16/36  brain]
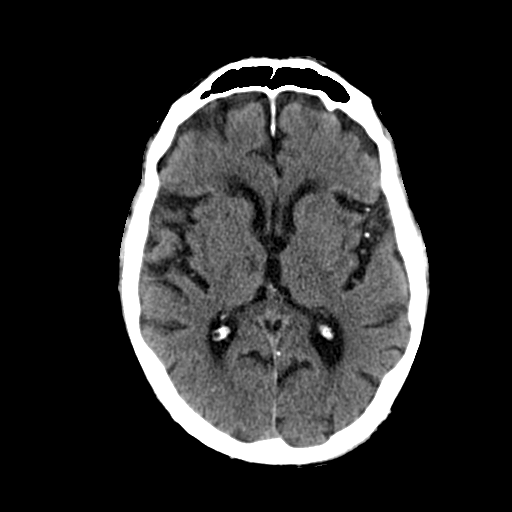
[im 19/36  brain]
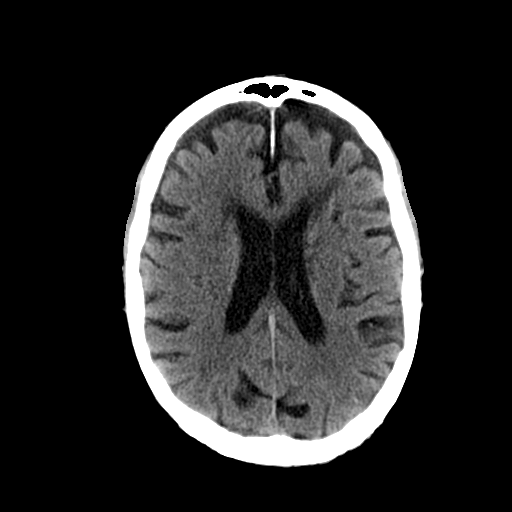
[im 20/36  brain]
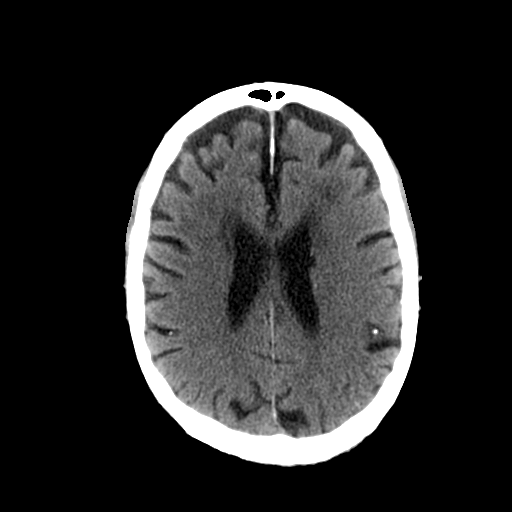
[im 20/36  bone]
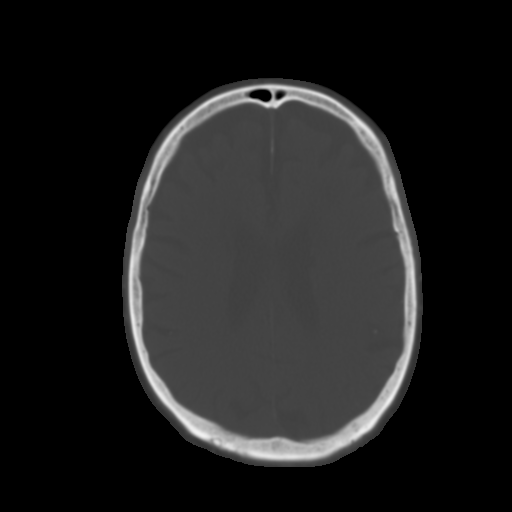
[im 22/36  brain]
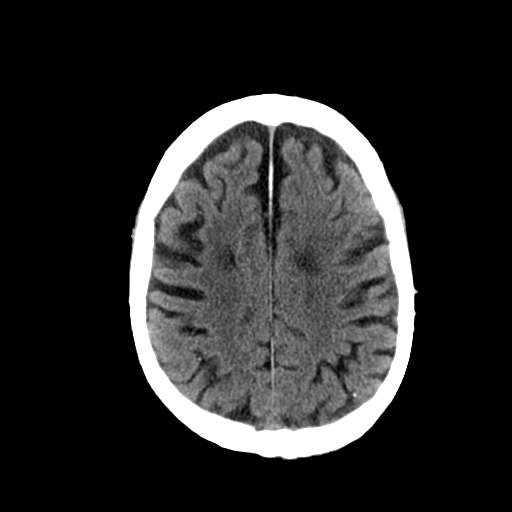
[im 25/36  brain]
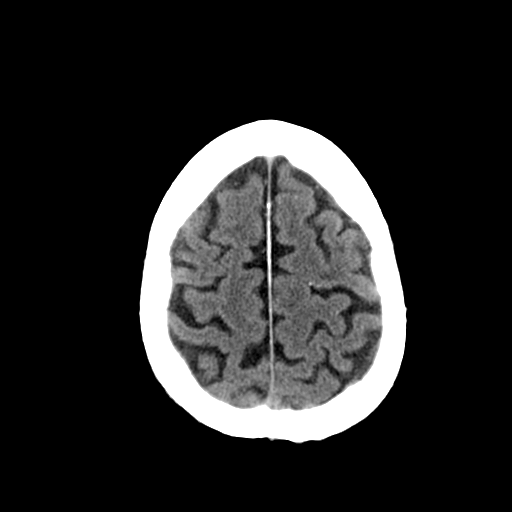
[im 27/36  brain]
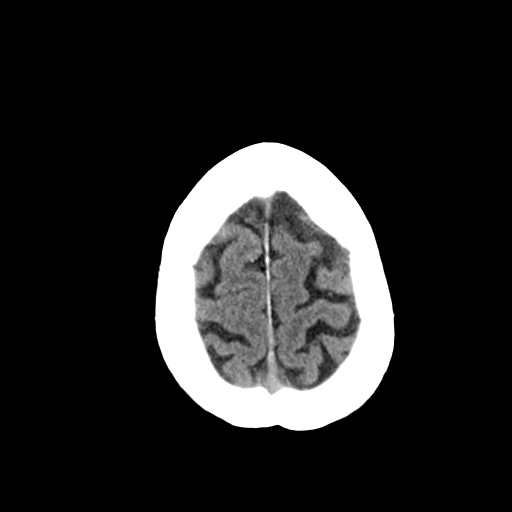
[im 29/36  brain]
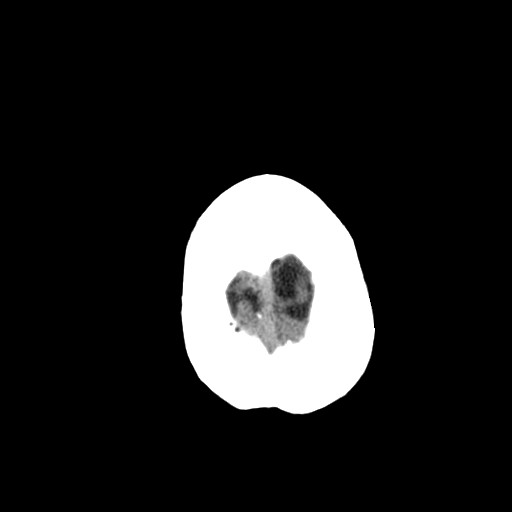
[im 29/36  bone]
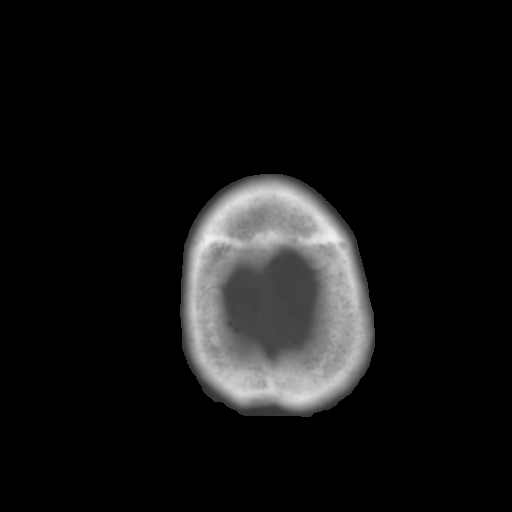
[im 32/36  brain]
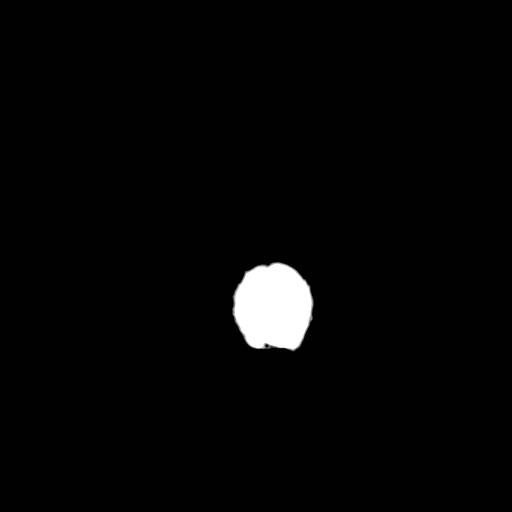
[im 34/36  brain]
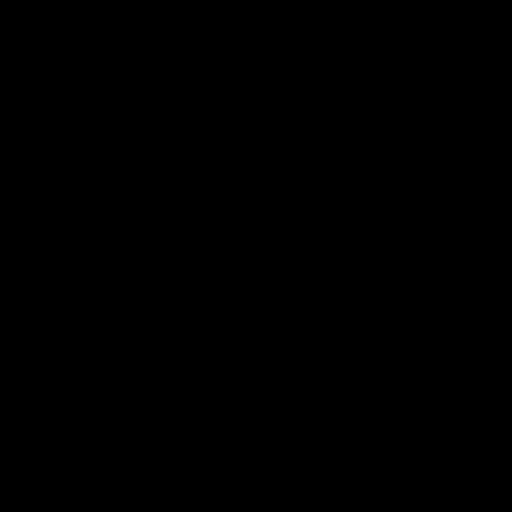

[15 of 30 positions shown; findings below may reference images not displayed]

FINDINGS: No sign of acute infarction.  There are extensive chronic
appearing ischemic changes.  Small vessel changes are noted in the
pons.  No focal cerebellar abnormality.  There are old small vessel
infarctions affecting the thalami and basal ganglia and extensively
throughout the hemispheric deep white matter.  There is an old left
frontal cortical and subcortical infarction.  No mass lesion,
hemorrhage, hydrocephalus or extra-axial collection.  The calvarium
is unremarkable.  Sinuses, and middle ears are clear.  There is
chronic appearing opacification of the mastoid region on the left.
This could indicate chronic inflammation.  There is atherosclerotic
calcification of the major vessels at the base of the brain.
IMPRESSION: Extensive chronic ischemic changes throughout the brain as outlined
above.  No sign of acute or subacute infarction.

Chronic changes of the left mastoid with sclerosis and
opacification of the air spaces.  This could indicate chronic and
ongoing inflammation.

## 2013-10-10 ENCOUNTER — Encounter: Payer: Self-pay | Admitting: Cardiovascular Disease

## 2013-10-10 ENCOUNTER — Ambulatory Visit (INDEPENDENT_AMBULATORY_CARE_PROVIDER_SITE_OTHER): Payer: Medicare Other | Admitting: Cardiovascular Disease

## 2013-10-10 VITALS — BP 146/80 | HR 70 | Ht 71.0 in | Wt 159.0 lb

## 2013-10-10 DIAGNOSIS — I519 Heart disease, unspecified: Secondary | ICD-10-CM

## 2013-10-10 DIAGNOSIS — I509 Heart failure, unspecified: Secondary | ICD-10-CM

## 2013-10-10 DIAGNOSIS — I739 Peripheral vascular disease, unspecified: Secondary | ICD-10-CM

## 2013-10-10 DIAGNOSIS — I6350 Cerebral infarction due to unspecified occlusion or stenosis of unspecified cerebral artery: Secondary | ICD-10-CM

## 2013-10-10 DIAGNOSIS — I5022 Chronic systolic (congestive) heart failure: Secondary | ICD-10-CM

## 2013-10-10 DIAGNOSIS — E785 Hyperlipidemia, unspecified: Secondary | ICD-10-CM

## 2013-10-10 DIAGNOSIS — I359 Nonrheumatic aortic valve disorder, unspecified: Secondary | ICD-10-CM

## 2013-10-10 DIAGNOSIS — I4891 Unspecified atrial fibrillation: Secondary | ICD-10-CM

## 2013-10-10 DIAGNOSIS — Z8679 Personal history of other diseases of the circulatory system: Secondary | ICD-10-CM

## 2013-10-10 DIAGNOSIS — I4821 Permanent atrial fibrillation: Secondary | ICD-10-CM

## 2013-10-10 DIAGNOSIS — Z7901 Long term (current) use of anticoagulants: Secondary | ICD-10-CM

## 2013-10-10 DIAGNOSIS — I5032 Chronic diastolic (congestive) heart failure: Secondary | ICD-10-CM

## 2013-10-10 DIAGNOSIS — Z79899 Other long term (current) drug therapy: Secondary | ICD-10-CM

## 2013-10-10 DIAGNOSIS — I059 Rheumatic mitral valve disease, unspecified: Secondary | ICD-10-CM

## 2013-10-10 DIAGNOSIS — I099 Rheumatic heart disease, unspecified: Secondary | ICD-10-CM

## 2013-10-10 DIAGNOSIS — L98499 Non-pressure chronic ulcer of skin of other sites with unspecified severity: Secondary | ICD-10-CM

## 2013-10-10 DIAGNOSIS — I70209 Unspecified atherosclerosis of native arteries of extremities, unspecified extremity: Secondary | ICD-10-CM

## 2013-10-10 NOTE — Patient Instructions (Signed)
Continue all current medications. Your physician wants you to follow up in: 6 months.  You will receive a reminder letter in the mail one-two months in advance.  If you don't receive a letter, please call our office to schedule the follow up appointment   

## 2013-10-10 NOTE — Progress Notes (Signed)
Patient ID: [name], male   DOB: 20-Feb-1932, 79 y.o.   MRN: [id number]      SUBJECTIVE: [name] is a 79 yo man with a history of rheumatic heart disease with mechanical aortic and mitral valves and history of cerebral embolism with INR around 3, necessitating an INR goal of 3.5-4.5, who presents for follow-up along with his wife. He also has rheumatoid arthritis and severe PAD.   He has chronic exertional dyspnea when he walks "a long way" on flat ground or climbs a flight of steps, but says he doesn't do much of that. His wife says "he gives out very easily", but they both say it's improved since his last visit with Dr. Shirlee Latch. He denies chest pain. Breathing has been better with increased Lasix, 40 mg q am and 20 mg q pm. No orthopnea or PND. He is not on a statin due to myalgias.   He's been having back pain and is wearing an OTC patch.  He does not report significant leg pain though he has known severe PAD. No foot ulcers. Main limitation actually seems to be knee and ankle pain. Continues to have difficulty with memory. Echo done in 9/13 showed EF 45% (stable) with normally functioning mechanical valves.   He denies bleeding problems with warfarin. He checks his BP at home and at Dr. Alonza Smoker office, and it is usually normal.   PMH: 1. Rheumatoid arthritis: on prednisone.  2. Dementia  3. Rheumatic heart disease: St Jude AVR, Starr-Edwards MVR. Echo (3/11) with mild LV dilation, EF 40-45%, mechanical MV with mean 5 mmHg, mechanical AoV mean gradient 9 mmHg, RV with normal systolic function, PA systolic pressure 37 mmHg. Echo (9/13): EF 45%, moderate LVH, mildly dilated LV, St Jude mechanical aortic valve with mean gradient 8, Starr-Edwards mechanical mitral valve with mean gradient 4 mmHg, moderate to severe LAE, normal RV.  4. Permanent atrial fibrillation  5. Cerebral embolic event with INR apparently around 3. Current INR goal has been 3.5-4.5.  6. PAD: Severe distal disease,  unlikely to be amenable to intervention. Plan has been conservative treatment unless life-threatening limb ischemia.  7. Hyperlipidemia: Myalgias with multiple statins.   SH: Married, lives in [place], no smoking.   FH: No premature CAD.   ROS: All systems reviewed and negative except as per HPI.      Allergies  Allergen Reactions  . Codeine   . Penicillins     Current Outpatient Prescriptions  Medication Sig Dispense Refill  . aspirin 81 MG tablet Take 81 mg by mouth daily.        . carvedilol (COREG) 3.125 MG tablet TAKE (1) TABLET TWICE DAILY.  60 tablet  0  . furosemide (LASIX) 20 MG tablet Take 40mg  (2 tabs) every morning & 20mg  (1 tab) every evening  90 tablet  6  . levothyroxine (SYNTHROID, LEVOTHROID) 125 MCG tablet Take 1 tablet by mouth Daily.      . Multiple Vitamin (MULTIVITAMIN) tablet Take 1 tablet by mouth daily.      . potassium chloride SA (K-DUR,KLOR-CON) 20 MEQ tablet Take 20 mEq by mouth daily.      . predniSONE (DELTASONE) 5 MG tablet Take 1 tablet by mouth Daily.      . ramipril (ALTACE) 5 MG capsule Take 1 capsule by mouth Daily.      Marland Kitchen terazosin (HYTRIN) 2 MG capsule Take 1 capsule by mouth Daily.      . traMADol-acetaminophen (ULTRACET) 37.5-325 MG per tablet  Take 1 tablet by mouth 3 (three) times daily.       Marland Kitchen triamcinolone (KENALOG) 0.1 % cream Apply 1 application topically 2 (two) times daily as needed.       . warfarin (COUMADIN) 5 MG tablet Take 1 tablet daily or as directed  Use BRAND name coumadin only  60 tablet  3   No current facility-administered medications for this visit.    Past Medical History  Diagnosis Date  . LV dysfunction      ejection fraction 45-50%  . Rheumatic heart disease      status post St. Jude aortic valve Starr-Edwards mitral valve replacement  . Permanent atrial fibrillation   . Cerebral embolism     Secondary to subtherapeutic INR  . Abdominal aortic aneurysm     small aneurysm  . Hypertension   . Rheumatoid  arthritis(714.0)     Severe requiring steroid therapy  . Gastric erosions      resolved  . Cholangitis     Complicated by Escherichia coli bacteremia without endocarditis status post ERCP and sphincterotomy.  . Chronic anticoagulation      INR 3.5-4.5    No past surgical history on file.  History   Social History  . Marital Status: Married    Spouse Name: N/A    Number of Children: N/A  . Years of Education: N/A   Occupational History  . Not on file.   Social History Main Topics  . Smoking status: Former Smoker -- 1.00 packs/day for 25 years    Types: Cigarettes    Quit date: 12/01/1973  . Smokeless tobacco: Never Used  . Alcohol Use: Not on file  . Drug Use: Not on file  . Sexual Activity: Not on file   Other Topics Concern  . Not on file   Social History Narrative  . No narrative on file    BP 146/80  Pulse 70    PHYSICAL EXAM General: NAD  Neck: JVP 8 cm, no thyromegaly or thyroid nodule.  Lungs: Clear to auscultation bilaterally with normal respiratory effort.  CV: Nondisplaced PMI. Heart regular, mechanical S1/S2, no S3/S4, 1/6 SEM. 1+ ankle edema bilaterally and wearing stockings. No carotid bruit. Unable to palpate pedal pulses. No foot ulcers.  Abdomen: Soft, nontender, no hepatosplenomegaly, no distention.  Neurologic: Alert and oriented x 3.  Psych: Normal affect.  Extremities: No clubbing or cyanosis.   ECG: reviewed and available in electronic records.      ASSESSMENT AND PLAN: 1. Mechanical aortic and mitral valves: INR goal has been 3.5-4.5 due to CVA with INR 3. He has tolerated this INR goal for several years now with no bleeding problems. Will continue. He should continue ASA 81 mg daily. Valves were functioning normally on recent echo.  2. Chronic systolic CHF: EF 16% on recent echo which is stable compared to the past. - Continue current ramipril and Coreg, and Lasix 40 qam, 20 qpm.  3. Atrial fibrillation: Chronic, good rate control,  and rhythm is regular today. Continue warfarin.  4. PAD: No significant leg pain though there is severe distal disease. Plan for medical management unless life-threatening limb ischemia develops. He cannot tolerate statins due to myalgias.   Dispo: f/u 6 months.  Prentice Docker, M.D., F.A.C.C.

## 2013-10-21 ENCOUNTER — Ambulatory Visit (INDEPENDENT_AMBULATORY_CARE_PROVIDER_SITE_OTHER): Payer: Medicare Other | Admitting: *Deleted

## 2013-10-21 DIAGNOSIS — I359 Nonrheumatic aortic valve disorder, unspecified: Secondary | ICD-10-CM

## 2013-10-21 DIAGNOSIS — I6350 Cerebral infarction due to unspecified occlusion or stenosis of unspecified cerebral artery: Secondary | ICD-10-CM

## 2013-10-21 DIAGNOSIS — I059 Rheumatic mitral valve disease, unspecified: Secondary | ICD-10-CM

## 2013-10-21 DIAGNOSIS — Z954 Presence of other heart-valve replacement: Secondary | ICD-10-CM

## 2013-10-21 DIAGNOSIS — Z9889 Other specified postprocedural states: Secondary | ICD-10-CM

## 2013-10-21 DIAGNOSIS — Z8679 Personal history of other diseases of the circulatory system: Secondary | ICD-10-CM

## 2013-10-21 DIAGNOSIS — Z7901 Long term (current) use of anticoagulants: Secondary | ICD-10-CM

## 2013-10-25 ENCOUNTER — Other Ambulatory Visit: Payer: Self-pay | Admitting: Cardiology

## 2013-11-04 ENCOUNTER — Ambulatory Visit (INDEPENDENT_AMBULATORY_CARE_PROVIDER_SITE_OTHER): Payer: Medicare Other | Admitting: *Deleted

## 2013-11-04 DIAGNOSIS — I359 Nonrheumatic aortic valve disorder, unspecified: Secondary | ICD-10-CM

## 2013-11-04 DIAGNOSIS — I059 Rheumatic mitral valve disease, unspecified: Secondary | ICD-10-CM

## 2013-11-04 DIAGNOSIS — Z7901 Long term (current) use of anticoagulants: Secondary | ICD-10-CM

## 2013-11-04 DIAGNOSIS — Z8679 Personal history of other diseases of the circulatory system: Secondary | ICD-10-CM

## 2013-11-04 DIAGNOSIS — Z9889 Other specified postprocedural states: Secondary | ICD-10-CM

## 2013-11-04 DIAGNOSIS — I6350 Cerebral infarction due to unspecified occlusion or stenosis of unspecified cerebral artery: Secondary | ICD-10-CM

## 2013-11-04 DIAGNOSIS — Z954 Presence of other heart-valve replacement: Secondary | ICD-10-CM

## 2013-11-04 LAB — POCT INR: INR: 3.7

## 2013-11-08 ENCOUNTER — Other Ambulatory Visit: Payer: Self-pay | Admitting: Cardiology

## 2013-11-18 ENCOUNTER — Ambulatory Visit (INDEPENDENT_AMBULATORY_CARE_PROVIDER_SITE_OTHER): Payer: Medicare Other | Admitting: Pharmacist

## 2013-11-18 DIAGNOSIS — Z954 Presence of other heart-valve replacement: Secondary | ICD-10-CM

## 2013-11-18 DIAGNOSIS — Z9889 Other specified postprocedural states: Secondary | ICD-10-CM

## 2013-11-18 DIAGNOSIS — Z7901 Long term (current) use of anticoagulants: Secondary | ICD-10-CM

## 2013-11-18 DIAGNOSIS — Z8679 Personal history of other diseases of the circulatory system: Secondary | ICD-10-CM

## 2013-11-18 DIAGNOSIS — I059 Rheumatic mitral valve disease, unspecified: Secondary | ICD-10-CM

## 2013-11-18 DIAGNOSIS — I359 Nonrheumatic aortic valve disorder, unspecified: Secondary | ICD-10-CM

## 2013-11-18 DIAGNOSIS — I6350 Cerebral infarction due to unspecified occlusion or stenosis of unspecified cerebral artery: Secondary | ICD-10-CM

## 2013-12-08 ENCOUNTER — Other Ambulatory Visit: Payer: Self-pay | Admitting: Cardiology

## 2013-12-09 ENCOUNTER — Ambulatory Visit (INDEPENDENT_AMBULATORY_CARE_PROVIDER_SITE_OTHER): Payer: Medicare Other | Admitting: *Deleted

## 2013-12-09 DIAGNOSIS — Z9889 Other specified postprocedural states: Secondary | ICD-10-CM

## 2013-12-09 DIAGNOSIS — I6350 Cerebral infarction due to unspecified occlusion or stenosis of unspecified cerebral artery: Secondary | ICD-10-CM

## 2013-12-09 DIAGNOSIS — I059 Rheumatic mitral valve disease, unspecified: Secondary | ICD-10-CM

## 2013-12-09 DIAGNOSIS — Z8679 Personal history of other diseases of the circulatory system: Secondary | ICD-10-CM

## 2013-12-09 DIAGNOSIS — I359 Nonrheumatic aortic valve disorder, unspecified: Secondary | ICD-10-CM

## 2013-12-09 DIAGNOSIS — Z954 Presence of other heart-valve replacement: Secondary | ICD-10-CM

## 2013-12-09 DIAGNOSIS — Z7901 Long term (current) use of anticoagulants: Secondary | ICD-10-CM

## 2013-12-09 LAB — POCT INR: INR: 3.3

## 2013-12-23 ENCOUNTER — Ambulatory Visit (INDEPENDENT_AMBULATORY_CARE_PROVIDER_SITE_OTHER): Payer: Medicare Other | Admitting: *Deleted

## 2013-12-23 DIAGNOSIS — Z5181 Encounter for therapeutic drug level monitoring: Secondary | ICD-10-CM | POA: Insufficient documentation

## 2013-12-23 DIAGNOSIS — I059 Rheumatic mitral valve disease, unspecified: Secondary | ICD-10-CM

## 2013-12-23 DIAGNOSIS — Z7901 Long term (current) use of anticoagulants: Secondary | ICD-10-CM

## 2013-12-23 DIAGNOSIS — Z9889 Other specified postprocedural states: Secondary | ICD-10-CM

## 2013-12-23 DIAGNOSIS — I359 Nonrheumatic aortic valve disorder, unspecified: Secondary | ICD-10-CM

## 2013-12-23 DIAGNOSIS — Z8679 Personal history of other diseases of the circulatory system: Secondary | ICD-10-CM

## 2013-12-23 DIAGNOSIS — Z954 Presence of other heart-valve replacement: Secondary | ICD-10-CM

## 2013-12-23 DIAGNOSIS — I6350 Cerebral infarction due to unspecified occlusion or stenosis of unspecified cerebral artery: Secondary | ICD-10-CM

## 2013-12-23 LAB — POCT INR: INR: 3.5

## 2013-12-26 ENCOUNTER — Other Ambulatory Visit: Payer: Self-pay | Admitting: Cardiology

## 2014-01-06 ENCOUNTER — Ambulatory Visit (INDEPENDENT_AMBULATORY_CARE_PROVIDER_SITE_OTHER): Payer: Medicare Other | Admitting: *Deleted

## 2014-01-06 DIAGNOSIS — Z9889 Other specified postprocedural states: Secondary | ICD-10-CM

## 2014-01-06 DIAGNOSIS — Z8679 Personal history of other diseases of the circulatory system: Secondary | ICD-10-CM

## 2014-01-06 DIAGNOSIS — Z954 Presence of other heart-valve replacement: Secondary | ICD-10-CM

## 2014-01-06 DIAGNOSIS — Z5181 Encounter for therapeutic drug level monitoring: Secondary | ICD-10-CM

## 2014-01-06 DIAGNOSIS — I059 Rheumatic mitral valve disease, unspecified: Secondary | ICD-10-CM

## 2014-01-06 DIAGNOSIS — Z7901 Long term (current) use of anticoagulants: Secondary | ICD-10-CM

## 2014-01-06 DIAGNOSIS — I359 Nonrheumatic aortic valve disorder, unspecified: Secondary | ICD-10-CM

## 2014-01-06 DIAGNOSIS — I6350 Cerebral infarction due to unspecified occlusion or stenosis of unspecified cerebral artery: Secondary | ICD-10-CM

## 2014-01-06 LAB — POCT INR: INR: 4.8

## 2014-01-09 ENCOUNTER — Telehealth: Payer: Self-pay | Admitting: Cardiovascular Disease

## 2014-01-09 MED ORDER — FUROSEMIDE 20 MG PO TABS: ORAL_TABLET | ORAL | Status: DC

## 2014-01-09 NOTE — Telephone Encounter (Signed)
Done

## 2014-02-07 ENCOUNTER — Ambulatory Visit (INDEPENDENT_AMBULATORY_CARE_PROVIDER_SITE_OTHER): Payer: Medicare Other | Admitting: *Deleted

## 2014-02-07 DIAGNOSIS — I6350 Cerebral infarction due to unspecified occlusion or stenosis of unspecified cerebral artery: Secondary | ICD-10-CM

## 2014-02-07 DIAGNOSIS — I059 Rheumatic mitral valve disease, unspecified: Secondary | ICD-10-CM

## 2014-02-07 DIAGNOSIS — Z7901 Long term (current) use of anticoagulants: Secondary | ICD-10-CM

## 2014-02-07 DIAGNOSIS — Z8679 Personal history of other diseases of the circulatory system: Secondary | ICD-10-CM

## 2014-02-07 DIAGNOSIS — Z5181 Encounter for therapeutic drug level monitoring: Secondary | ICD-10-CM

## 2014-02-07 DIAGNOSIS — Z954 Presence of other heart-valve replacement: Secondary | ICD-10-CM

## 2014-02-07 DIAGNOSIS — Z9889 Other specified postprocedural states: Secondary | ICD-10-CM

## 2014-02-07 DIAGNOSIS — I359 Nonrheumatic aortic valve disorder, unspecified: Secondary | ICD-10-CM

## 2014-02-07 LAB — POCT INR: INR: 4.5

## 2014-02-24 ENCOUNTER — Ambulatory Visit (INDEPENDENT_AMBULATORY_CARE_PROVIDER_SITE_OTHER): Payer: Medicare Other | Admitting: *Deleted

## 2014-02-24 DIAGNOSIS — I059 Rheumatic mitral valve disease, unspecified: Secondary | ICD-10-CM

## 2014-02-24 DIAGNOSIS — I6350 Cerebral infarction due to unspecified occlusion or stenosis of unspecified cerebral artery: Secondary | ICD-10-CM

## 2014-02-24 DIAGNOSIS — Z7901 Long term (current) use of anticoagulants: Secondary | ICD-10-CM

## 2014-02-24 DIAGNOSIS — Z5181 Encounter for therapeutic drug level monitoring: Secondary | ICD-10-CM

## 2014-02-24 DIAGNOSIS — Z8679 Personal history of other diseases of the circulatory system: Secondary | ICD-10-CM

## 2014-02-24 DIAGNOSIS — Z9889 Other specified postprocedural states: Secondary | ICD-10-CM

## 2014-02-24 DIAGNOSIS — Z954 Presence of other heart-valve replacement: Secondary | ICD-10-CM

## 2014-02-24 DIAGNOSIS — I359 Nonrheumatic aortic valve disorder, unspecified: Secondary | ICD-10-CM

## 2014-02-24 LAB — POCT INR: INR: 3

## 2014-03-03 ENCOUNTER — Emergency Department (HOSPITAL_COMMUNITY)
Admission: EM | Admit: 2014-03-03 | Discharge: 2014-03-03 | Disposition: A | Discharge status: Home / Self Care | Payer: Medicare Other | Attending: Emergency Medicine | Admitting: Emergency Medicine

## 2014-03-03 ENCOUNTER — Emergency Department (HOSPITAL_COMMUNITY): Payer: Medicare Other

## 2014-03-03 ENCOUNTER — Encounter (HOSPITAL_COMMUNITY): Payer: Self-pay | Admitting: Emergency Medicine

## 2014-03-03 DIAGNOSIS — I10 Essential (primary) hypertension: Secondary | ICD-10-CM | POA: Insufficient documentation

## 2014-03-03 DIAGNOSIS — IMO0002 Reserved for concepts with insufficient information to code with codable children: Secondary | ICD-10-CM | POA: Insufficient documentation

## 2014-03-03 DIAGNOSIS — Z8673 Personal history of transient ischemic attack (TIA), and cerebral infarction without residual deficits: Secondary | ICD-10-CM | POA: Insufficient documentation

## 2014-03-03 DIAGNOSIS — Z79899 Other long term (current) drug therapy: Secondary | ICD-10-CM | POA: Insufficient documentation

## 2014-03-03 DIAGNOSIS — M069 Rheumatoid arthritis, unspecified: Secondary | ICD-10-CM | POA: Insufficient documentation

## 2014-03-03 DIAGNOSIS — Z8679 Personal history of other diseases of the circulatory system: Secondary | ICD-10-CM | POA: Insufficient documentation

## 2014-03-03 DIAGNOSIS — Z87891 Personal history of nicotine dependence: Secondary | ICD-10-CM | POA: Insufficient documentation

## 2014-03-03 DIAGNOSIS — Z8719 Personal history of other diseases of the digestive system: Secondary | ICD-10-CM | POA: Insufficient documentation

## 2014-03-03 DIAGNOSIS — M549 Dorsalgia, unspecified: Secondary | ICD-10-CM | POA: Insufficient documentation

## 2014-03-03 DIAGNOSIS — Z7901 Long term (current) use of anticoagulants: Secondary | ICD-10-CM | POA: Insufficient documentation

## 2014-03-03 DIAGNOSIS — Z7982 Long term (current) use of aspirin: Secondary | ICD-10-CM | POA: Insufficient documentation

## 2014-03-03 DIAGNOSIS — I4891 Unspecified atrial fibrillation: Secondary | ICD-10-CM | POA: Insufficient documentation

## 2014-03-03 DIAGNOSIS — Z880 Allergy status to penicillin: Secondary | ICD-10-CM | POA: Insufficient documentation

## 2014-03-03 HISTORY — DX: Cerebral infarction, unspecified: I63.9

## 2014-03-03 LAB — CBC
HEMATOCRIT: 40.9 % (ref 39.0–52.0)
HEMOGLOBIN: 13.6 g/dL (ref 13.0–17.0)
MCH: 33 pg (ref 26.0–34.0)
MCHC: 33.3 g/dL (ref 30.0–36.0)
MCV: 99.3 fL (ref 78.0–100.0)
Platelets: 227 10*3/uL (ref 150–400)
RBC: 4.12 MIL/uL — ABNORMAL LOW (ref 4.22–5.81)
RDW: 14 % (ref 11.5–15.5)
WBC: 9.9 10*3/uL (ref 4.0–10.5)

## 2014-03-03 LAB — URINALYSIS, ROUTINE W REFLEX MICROSCOPIC
Bilirubin Urine: NEGATIVE
GLUCOSE, UA: NEGATIVE mg/dL
Ketones, ur: NEGATIVE mg/dL
LEUKOCYTES UA: NEGATIVE
Nitrite: NEGATIVE
PH: 5.5 (ref 5.0–8.0)
PROTEIN: NEGATIVE mg/dL
Specific Gravity, Urine: 1.01 (ref 1.005–1.030)
Urobilinogen, UA: 0.2 mg/dL (ref 0.0–1.0)

## 2014-03-03 LAB — BASIC METABOLIC PANEL
BUN: 16 mg/dL (ref 6–23)
CHLORIDE: 95 meq/L — AB (ref 96–112)
CO2: 26 meq/L (ref 19–32)
CREATININE: 0.98 mg/dL (ref 0.50–1.35)
Calcium: 10.5 mg/dL (ref 8.4–10.5)
GFR calc Af Amer: 88 mL/min — ABNORMAL LOW (ref >90)
GFR calc non Af Amer: 76 mL/min — ABNORMAL LOW (ref >90)
GLUCOSE: 186 mg/dL — AB (ref 70–99)
POTASSIUM: 5.4 meq/L — AB (ref 3.7–5.3)
Sodium: 135 mEq/L — ABNORMAL LOW (ref 137–147)

## 2014-03-03 LAB — URINE MICROSCOPIC-ADD ON

## 2014-03-03 MED ORDER — OXYCODONE-ACETAMINOPHEN 5-325 MG PO TABS: 2.0000 | ORAL_TABLET | ORAL | Status: DC | PRN

## 2014-03-03 MED ORDER — POTASSIUM CHLORIDE ER 10 MEQ PO TBCR: 10.0000 meq | EXTENDED_RELEASE_TABLET | Freq: Every evening | ORAL | Status: AC

## 2014-03-03 MED ORDER — ONDANSETRON 8 MG PO TBDP
8.0000 mg | ORAL_TABLET | Freq: Once | ORAL | Status: AC
  Administered 2014-03-03: 8 mg via ORAL
  Filled 2014-03-03: qty 1

## 2014-03-03 MED ORDER — OXYCODONE-ACETAMINOPHEN 5-325 MG PO TABS
1.0000 | ORAL_TABLET | Freq: Once | ORAL | Status: AC
  Administered 2014-03-03: 1 via ORAL
  Filled 2014-03-03: qty 1

## 2014-03-03 NOTE — Discharge Instructions (Signed)
Do not take your potassium for 2 days. Start taking it again on Sunday evening and then take only 1 pill. Follow up with Dr. Ouida Sills next week Back Pain, Adult Low back pain is very common. About 1 in 5 people have back pain.The cause of low back pain is rarely dangerous. The pain often gets better over time.About half of people with a sudden onset of back pain feel better in just 2 weeks. About 8 in 10 people feel better by 6 weeks.  CAUSES Some common causes of back pain include:  Strain of the muscles or ligaments supporting the spine.  Wear and tear (degeneration) of the spinal discs.  Arthritis.  Direct injury to the back. DIAGNOSIS Most of the time, the direct cause of low back pain is not known.However, back pain can be treated effectively even when the exact cause of the pain is unknown.Answering your caregiver's questions about your overall health and symptoms is one of the most accurate ways to make sure the cause of your pain is not dangerous. If your caregiver needs more information, he or she may order lab work or imaging tests (X-rays or MRIs).However, even if imaging tests show changes in your back, this usually does not require surgery. HOME CARE INSTRUCTIONS For many people, back pain returns.Since low back pain is rarely dangerous, it is often a condition that people can learn to [facility] their own.   Remain active. It is stressful on the back to sit or stand in one place. Do not sit, drive, or stand in one place for more than 30 minutes at a time. Take short walks on level surfaces as soon as pain allows.Try to increase the length of time you walk each day.  Do not stay in bed.Resting more than 1 or 2 days can delay your recovery.  Do not avoid exercise or work.Your body is made to move.It is not dangerous to be active, even though your back may hurt.Your back will likely heal faster if you return to being active before your pain is gone.  Pay attention to your  body when you bend and lift. Many people have less discomfortwhen lifting if they bend their knees, keep the load close to their bodies,and avoid twisting. Often, the most comfortable positions are those that put less stress on your recovering back.  Find a comfortable position to sleep. Use a firm mattress and lie on your side with your knees slightly bent. If you lie on your back, put a pillow under your knees.  Only take over-the-counter or prescription medicines as directed by your caregiver. Over-the-counter medicines to reduce pain and inflammation are often the most helpful.Your caregiver may prescribe muscle relaxant drugs.These medicines help dull your pain so you can more quickly return to your normal activities and healthy exercise.  Put ice on the injured area.  Put ice in a plastic bag.  Place a towel between your skin and the bag.  Leave the ice on for 15-20 minutes, 03-04 times a day for the first 2 to 3 days. After that, ice and heat may be alternated to reduce pain and spasms.  Ask your caregiver about trying back exercises and gentle massage. This may be of some benefit.  Avoid feeling anxious or stressed.Stress increases muscle tension and can worsen back pain.It is important to recognize when you are anxious or stressed and learn ways to manage it.Exercise is a great option. SEEK MEDICAL CARE IF:  You have pain that is not relieved  with rest or medicine.  You have pain that does not improve in 1 week.  You have new symptoms.  You are generally not feeling well. SEEK IMMEDIATE MEDICAL CARE IF:   You have pain that radiates from your back into your legs.  You develop new bowel or bladder control problems.  You have unusual weakness or numbness in your arms or legs.  You develop nausea or vomiting.  You develop abdominal pain.  You feel faint. Document Released: 11/17/2005 Document Revised: 05/18/2012 Document Reviewed: 04/07/2011 [facility] Patient  Information 2014 [place], [place].

## 2014-03-03 NOTE — ED Provider Notes (Signed)
CSN: [id number]     Arrival date & time 03/03/14  1419 History  This chart was scribed for Toy Baker, MD by Dorothey Baseman, ED Scribe. This patient was seen in room APA15/APA15 and the patient's care was started at 3:45 PM.    Chief Complaint  Patient presents with  . Back Pain   The history is provided by the patient and the spouse. No language interpreter was used.   HPI Comments: [name] is a 80 y.o. Male with a history of rheumatoid arthritis who presents to the Emergency Department complaining of a constant, non-radiating pain to the entire length of the back, worse on the right side, that he states has been ongoing for the past 2-3 weeks, but has been progressively worsening. Patient denies any potential injury or trauma to the area or history of surgeries around the area. He states that the pain is exacerbated with movement. His wife reports that the patient followed up with his PCP via telephone for similar complaints and started on a muscle relaxant, which he states has only been able to provide temporary relief of his pain. His wife reports that the patient has appeared intermittently confused, but has a history of Alzheimer's, and denies any recent changes. He denies cough, shortness of breath, hematuria, rash. Patient has allergies to codeine and penicillins. Patient has a history of rheumatic heart disease, permanent atrial fibrillation, cerebral embolism, stroke, abdominal aortic aneurysm, HTN, gastric erosions, and cholangitis. He denies any dysuria or hematuria. No dyspnea. No radicular sx  Past Medical History  Diagnosis Date  . LV dysfunction      ejection fraction 45-50%  . Rheumatic heart disease      status post St. Jude aortic valve Starr-Edwards mitral valve replacement  . Permanent atrial fibrillation   . Cerebral embolism     Secondary to subtherapeutic INR  . Abdominal aortic aneurysm     small aneurysm  . Hypertension   . Rheumatoid arthritis(714.0)      Severe requiring steroid therapy  . Gastric erosions      resolved  . Cholangitis     Complicated by Escherichia coli bacteremia without endocarditis status post ERCP and sphincterotomy.  . Chronic anticoagulation      INR 3.5-4.5  . Stroke    History reviewed. No pertinent past surgical history. History reviewed. No pertinent family history. History  Substance Use Topics  . Smoking status: Former Smoker -- 1.00 packs/day for 25 years    Types: Cigarettes    Quit date: 12/01/1973  . Smokeless tobacco: Never Used  . Alcohol Use: Not on file    Review of Systems  Respiratory: Negative for cough and shortness of breath.   Genitourinary: Negative for hematuria.  Musculoskeletal: Positive for back pain.  Skin: Negative for rash.  Psychiatric/Behavioral: Positive for confusion (baseline).  All other systems reviewed and are negative.      Allergies  Codeine and Penicillins  Home Medications   Current Outpatient Rx  Name  Route  Sig  Dispense  Refill  . aspirin 81 MG tablet   Oral   Take 81 mg by mouth daily.           . carvedilol (COREG) 3.125 MG tablet      TAKE (1) TABLET TWICE DAILY.   60 tablet   0   . carvedilol (COREG) 3.125 MG tablet      TAKE (1) TABLET TWICE DAILY.   60 tablet   5   .  COUMADIN 5 MG tablet      TAKE 1/2 TABLET TWO DAYS PER WEEK AND 1 TABLET ALL OTHER DAYS OR AS DIRECTED.   30 tablet   6   . furosemide (LASIX) 20 MG tablet      Take 2 tablets in the morning & 1 tablet in the evening   90 tablet   6   . levothyroxine (SYNTHROID, LEVOTHROID) 125 MCG tablet   Oral   Take 1 tablet by mouth Daily.         . Multiple Vitamin (MULTIVITAMIN) tablet   Oral   Take 1 tablet by mouth daily.         . potassium chloride SA (K-DUR,KLOR-CON) 20 MEQ tablet   Oral   Take 20 mEq by mouth daily.         . predniSONE (DELTASONE) 5 MG tablet   Oral   Take 1 tablet by mouth Daily.         . ramipril (ALTACE) 5 MG capsule    Oral   Take 1 capsule by mouth Daily.         Marland Kitchen terazosin (HYTRIN) 2 MG capsule   Oral   Take 1 capsule by mouth Daily.         . traMADol-acetaminophen (ULTRACET) 37.5-325 MG per tablet   Oral   Take 1 tablet by mouth 4 (four) times daily.          Marland Kitchen triamcinolone (KENALOG) 0.1 % cream   Topical   Apply 1 application topically 2 (two) times daily as needed.           Triage Vitals: BP 103/85  Pulse 65  Temp(Src) 97.7 F (36.5 C) (Oral)  Resp 18  Ht 5\' 5"  (1.651 m)  Wt 160 lb (72.576 kg)  BMI 26.63 kg/m2  SpO2 95%  Physical Exam  Nursing note and vitals reviewed. Constitutional: He is oriented to person, place, and time. He appears well-developed and well-nourished.  Non-toxic appearance. No distress.  HENT:  Head: Normocephalic and atraumatic.  Eyes: Conjunctivae, EOM and lids are normal. Pupils are equal, round, and reactive to light.  Neck: Normal range of motion. Neck supple. No tracheal deviation present. No mass present.  Cardiovascular: Normal rate, regular rhythm and normal heart sounds.  Exam reveals no gallop.   No murmur heard. Pulmonary/Chest: Effort normal and breath sounds normal. No stridor. No respiratory distress. He has no decreased breath sounds. He has no wheezes. He has no rhonchi. He has no rales.  No palpable rib pain.   Abdominal: Soft. Normal appearance and bowel sounds are normal. He exhibits no distension. There is no tenderness. There is no rebound and no CVA tenderness.  Musculoskeletal: Normal range of motion. He exhibits no edema and no tenderness.  No bony tenderness along the C, T, or L spine. Pinpoint tenderness at the right lateral thoracic spine. No crepitus, swelling.   Neurological: He is alert and oriented to person, place, and time. He has normal strength. No cranial nerve deficit or sensory deficit. GCS eye subscore is 4. GCS verbal subscore is 5. GCS motor subscore is 6.  Skin: Skin is warm and dry. No abrasion and no rash  noted.  Psychiatric: He has a normal mood and affect. His speech is normal and behavior is normal.    ED Course  Procedures (including critical care time)  DIAGNOSTIC STUDIES: Oxygen Saturation is 95% on room air, normal by my interpretation.    COORDINATION OF CARE:  3:50 PM- Will order an x-ray of the T spine. Ordered UA, CBC, BMP. Will order Percocet and Zofran to manage symptoms. Discussed treatment plan with patient at bedside and patient verbalized agreement.     Labs Review Labs Reviewed  URINALYSIS, ROUTINE W REFLEX MICROSCOPIC - Abnormal; Notable for the following:    Hgb urine dipstick TRACE (*)    All other components within normal limits  CBC - Abnormal; Notable for the following:    RBC 4.12 (*)    All other components within normal limits  BASIC METABOLIC PANEL - Abnormal; Notable for the following:    Sodium 135 (*)    Potassium 5.4 (*)    Chloride 95 (*)    Glucose, Bld 186 (*)    GFR calc non Af Amer 76 (*)    GFR calc Af Amer 88 (*)    All other components within normal limits  URINE MICROSCOPIC-ADD ON   Imaging Review Dg Thoracic Spine 2 View  03/03/2014   CLINICAL DATA:  Back pain  EXAM: THORACIC SPINE - 2 VIEW  COMPARISON:  09/10/2007  FINDINGS: Postsurgical changes are again identified. No compression deformities are seen. No paraspinal mass lesion is noted. Diffuse aortic calcifications are seen.  IMPRESSION: No acute abnormality is noted.   Electronically Signed   By: Alcide Clever M.D.   On: 03/03/2014 16:26     EKG Interpretation None      MDM   Final diagnoses:  None    Pt given percocet for pain, feels better--mild hypokalemia noted and pt will be told to hold his k-dur for 2 days, Suspect musculoskeletal strain--stable for d/c      Toy Baker, MD 03/03/14 [phone]

## 2014-03-03 NOTE — ED Notes (Signed)
Py co generalized back pain, increased confusion, neck pain. Dr Ouida Sills called in a "muscle relaxer" but it has not helped. Pt's wife states the pt has been very agitated.

## 2014-03-04 ENCOUNTER — Encounter (HOSPITAL_COMMUNITY): Payer: Self-pay | Admitting: Emergency Medicine

## 2014-03-04 ENCOUNTER — Inpatient Hospital Stay (HOSPITAL_COMMUNITY)
Admission: EM | Admit: 2014-03-04 | Discharge: 2014-03-10 | DRG: 393 | Disposition: A | Discharge status: Skilled Nursing Facility | Payer: Medicare Other | Attending: Internal Medicine | Admitting: Internal Medicine

## 2014-03-04 DIAGNOSIS — R197 Diarrhea, unspecified: Secondary | ICD-10-CM

## 2014-03-04 DIAGNOSIS — Z87891 Personal history of nicotine dependence: Secondary | ICD-10-CM

## 2014-03-04 DIAGNOSIS — I359 Nonrheumatic aortic valve disorder, unspecified: Secondary | ICD-10-CM

## 2014-03-04 DIAGNOSIS — I509 Heart failure, unspecified: Secondary | ICD-10-CM | POA: Diagnosis present

## 2014-03-04 DIAGNOSIS — I10 Essential (primary) hypertension: Secondary | ICD-10-CM | POA: Diagnosis present

## 2014-03-04 DIAGNOSIS — M069 Rheumatoid arthritis, unspecified: Secondary | ICD-10-CM | POA: Diagnosis present

## 2014-03-04 DIAGNOSIS — Z9889 Other specified postprocedural states: Secondary | ICD-10-CM

## 2014-03-04 DIAGNOSIS — Z7901 Long term (current) use of anticoagulants: Secondary | ICD-10-CM

## 2014-03-04 DIAGNOSIS — I099 Rheumatic heart disease, unspecified: Secondary | ICD-10-CM | POA: Diagnosis present

## 2014-03-04 DIAGNOSIS — D689 Coagulation defect, unspecified: Secondary | ICD-10-CM

## 2014-03-04 DIAGNOSIS — I4821 Permanent atrial fibrillation: Secondary | ICD-10-CM

## 2014-03-04 DIAGNOSIS — K625 Hemorrhage of anus and rectum: Secondary | ICD-10-CM

## 2014-03-04 DIAGNOSIS — Z8673 Personal history of transient ischemic attack (TIA), and cerebral infarction without residual deficits: Secondary | ICD-10-CM

## 2014-03-04 DIAGNOSIS — K5289 Other specified noninfective gastroenteritis and colitis: Secondary | ICD-10-CM

## 2014-03-04 DIAGNOSIS — K559 Vascular disorder of intestine, unspecified: Principal | ICD-10-CM

## 2014-03-04 DIAGNOSIS — I5043 Acute on chronic combined systolic (congestive) and diastolic (congestive) heart failure: Secondary | ICD-10-CM | POA: Diagnosis present

## 2014-03-04 DIAGNOSIS — I4891 Unspecified atrial fibrillation: Secondary | ICD-10-CM | POA: Diagnosis present

## 2014-03-04 DIAGNOSIS — R791 Abnormal coagulation profile: Secondary | ICD-10-CM | POA: Diagnosis present

## 2014-03-04 DIAGNOSIS — E039 Hypothyroidism, unspecified: Secondary | ICD-10-CM | POA: Diagnosis present

## 2014-03-04 DIAGNOSIS — Z8249 Family history of ischemic heart disease and other diseases of the circulatory system: Secondary | ICD-10-CM

## 2014-03-04 DIAGNOSIS — D649 Anemia, unspecified: Secondary | ICD-10-CM | POA: Diagnosis present

## 2014-03-04 DIAGNOSIS — K529 Noninfective gastroenteritis and colitis, unspecified: Secondary | ICD-10-CM

## 2014-03-04 DIAGNOSIS — Z66 Do not resuscitate: Secondary | ICD-10-CM | POA: Diagnosis present

## 2014-03-04 DIAGNOSIS — R1110 Vomiting, unspecified: Secondary | ICD-10-CM

## 2014-03-04 DIAGNOSIS — E876 Hypokalemia: Secondary | ICD-10-CM | POA: Diagnosis present

## 2014-03-04 DIAGNOSIS — F3289 Other specified depressive episodes: Secondary | ICD-10-CM | POA: Diagnosis present

## 2014-03-04 DIAGNOSIS — I059 Rheumatic mitral valve disease, unspecified: Secondary | ICD-10-CM

## 2014-03-04 DIAGNOSIS — N179 Acute kidney failure, unspecified: Secondary | ICD-10-CM

## 2014-03-04 DIAGNOSIS — Z954 Presence of other heart-valve replacement: Secondary | ICD-10-CM

## 2014-03-04 DIAGNOSIS — E119 Type 2 diabetes mellitus without complications: Secondary | ICD-10-CM | POA: Diagnosis present

## 2014-03-04 DIAGNOSIS — F0390 Unspecified dementia without behavioral disturbance: Secondary | ICD-10-CM | POA: Diagnosis present

## 2014-03-04 DIAGNOSIS — Z7982 Long term (current) use of aspirin: Secondary | ICD-10-CM

## 2014-03-04 DIAGNOSIS — E860 Dehydration: Secondary | ICD-10-CM

## 2014-03-04 LAB — CBC WITH DIFFERENTIAL/PLATELET
Basophils Absolute: 0 10*3/uL (ref 0.0–0.1)
Basophils Relative: 0 % (ref 0–1)
EOS ABS: 0.2 10*3/uL (ref 0.0–0.7)
Eosinophils Relative: 2 % (ref 0–5)
HCT: 39.8 % (ref 39.0–52.0)
Hemoglobin: 13.1 g/dL (ref 13.0–17.0)
LYMPHS PCT: 6 % — AB (ref 12–46)
Lymphs Abs: 0.7 10*3/uL (ref 0.7–4.0)
MCH: 32.6 pg (ref 26.0–34.0)
MCHC: 32.9 g/dL (ref 30.0–36.0)
MCV: 99 fL (ref 78.0–100.0)
Monocytes Absolute: 0.8 10*3/uL (ref 0.1–1.0)
Monocytes Relative: 7 % (ref 3–12)
NEUTROS PCT: 86 % — AB (ref 43–77)
Neutro Abs: 11.2 10*3/uL — ABNORMAL HIGH (ref 1.7–7.7)
Platelets: 206 10*3/uL (ref 150–400)
RBC: 4.02 MIL/uL — AB (ref 4.22–5.81)
RDW: 14.1 % (ref 11.5–15.5)
WBC: 13 10*3/uL — AB (ref 4.0–10.5)

## 2014-03-04 LAB — TYPE AND SCREEN
ABO/RH(D): A NEG
Antibody Screen: NEGATIVE

## 2014-03-04 LAB — BASIC METABOLIC PANEL
BUN: 19 mg/dL (ref 6–23)
CALCIUM: 10.1 mg/dL (ref 8.4–10.5)
CO2: 25 mEq/L (ref 19–32)
Chloride: 95 mEq/L — ABNORMAL LOW (ref 96–112)
Creatinine, Ser: 1.9 mg/dL — ABNORMAL HIGH (ref 0.50–1.35)
GFR calc non Af Amer: 32 mL/min — ABNORMAL LOW (ref >90)
GFR, EST AFRICAN AMERICAN: 37 mL/min — AB (ref >90)
Glucose, Bld: 197 mg/dL — ABNORMAL HIGH (ref 70–99)
Potassium: 3.6 mEq/L — ABNORMAL LOW (ref 3.7–5.3)
SODIUM: 136 meq/L — AB (ref 137–147)

## 2014-03-04 LAB — PROTIME-INR
INR: 4.97 — ABNORMAL HIGH (ref 0.00–1.49)
Prothrombin Time: 44.2 seconds — ABNORMAL HIGH (ref 11.6–15.2)

## 2014-03-04 LAB — PRO B NATRIURETIC PEPTIDE: Pro B Natriuretic peptide (BNP): 2670 pg/mL — ABNORMAL HIGH (ref 0–450)

## 2014-03-04 MED ORDER — ASPIRIN EC 81 MG PO TBEC
81.0000 mg | DELAYED_RELEASE_TABLET | Freq: Every morning | ORAL | Status: DC
  Administered 2014-03-04: 81 mg via ORAL
  Filled 2014-03-04 (×3): qty 1

## 2014-03-04 MED ORDER — LEVOTHYROXINE SODIUM 25 MCG PO TABS
125.0000 ug | ORAL_TABLET | Freq: Every day | ORAL | Status: DC
  Administered 2014-03-04 – 2014-03-10 (×6): 125 ug via ORAL
  Filled 2014-03-04 (×16): qty 1

## 2014-03-04 MED ORDER — MORPHINE SULFATE 2 MG/ML IJ SOLN
1.0000 mg | INTRAMUSCULAR | Status: DC | PRN
  Administered 2014-03-06 – 2014-03-08 (×5): 1 mg via INTRAVENOUS
  Filled 2014-03-04 (×5): qty 1

## 2014-03-04 MED ORDER — ACETAMINOPHEN 325 MG PO TABS
650.0000 mg | ORAL_TABLET | Freq: Four times a day (QID) | ORAL | Status: DC | PRN
  Administered 2014-03-05 – 2014-03-07 (×4): 650 mg via ORAL
  Filled 2014-03-04 (×4): qty 2

## 2014-03-04 MED ORDER — SODIUM CHLORIDE 0.9 % IV SOLN
INTRAVENOUS | Status: DC
Start: 2014-03-04 — End: 2014-03-10
  Administered 2014-03-04 – 2014-03-07 (×3): via INTRAVENOUS

## 2014-03-04 MED ORDER — ONDANSETRON HCL 4 MG PO TABS
4.0000 mg | ORAL_TABLET | Freq: Four times a day (QID) | ORAL | Status: DC | PRN
  Administered 2014-03-08: 4 mg via ORAL
  Filled 2014-03-04: qty 1

## 2014-03-04 MED ORDER — ONDANSETRON HCL 4 MG/2ML IJ SOLN
4.0000 mg | Freq: Four times a day (QID) | INTRAMUSCULAR | Status: DC | PRN
  Administered 2014-03-07 – 2014-03-09 (×2): 4 mg via INTRAVENOUS
  Filled 2014-03-04 (×2): qty 2

## 2014-03-04 MED ORDER — PREDNISONE 10 MG PO TABS
5.0000 mg | ORAL_TABLET | Freq: Every morning | ORAL | Status: DC
  Administered 2014-03-06: 5 mg via ORAL
  Administered 2014-03-07: 09:00:00 via ORAL
  Administered 2014-03-08 – 2014-03-10 (×3): 5 mg via ORAL
  Filled 2014-03-04 (×6): qty 1

## 2014-03-04 MED ORDER — ACETAMINOPHEN 650 MG RE SUPP: 650.0000 mg | Freq: Four times a day (QID) | RECTAL | Status: DC | PRN

## 2014-03-04 MED ORDER — SODIUM CHLORIDE 0.9 % IV BOLUS (SEPSIS)
250.0000 mL | Freq: Once | INTRAVENOUS | Status: AC
  Administered 2014-03-04: 250 mL via INTRAVENOUS

## 2014-03-04 NOTE — ED Provider Notes (Signed)
CSN: [id number]     Arrival date & time 03/04/14  1943 History  This chart was scribed for Joya Gaskins, MD by Bennett Scrape, ED Scribe. This patient was seen in room APA18/APA18 and the patient's care was started at 7:56 PM.   Chief Complaint  Patient presents with  . Rectal Bleeding     Patient is a 80 y.o. male presenting with hematochezia. The history is provided by the patient, a relative and the spouse. No language interpreter was used.  Rectal Bleeding Quality:  Bright red Amount:  Moderate Duration: started today. Timing:  Intermittent Progression:  Worsening Chronicity:  New Context: diarrhea   Associated symptoms: abdominal pain and vomiting   Associated symptoms: no hematemesis     HPI Comments: [name] is a 80 y.o. male who is currently on Coumadin presents to the Emergency Department by ambulance from home complaining of several episodes of hematochezia described as bright red that started after 3 episodes of diarrhea and has worsened since.  Per family, pt reported feeling nauseated and begin vomiting and having diarrhea. Bright red blood was noted in the stool after the 3rd episode and has gotten worse since.  Family also states that the pt has been weaker than usual since the onset and pt admits to feeling extremely fatigued. Pt denies any recent falls or injuries. Wife reports one episode of hematochezia after having the Noro virus which required hospitalization. He denies any CP, hematemesis or SOB.  He was seen in the ED yesterday for right mid back pain described as sharp x1 week. The x-ray of the thoracic spine yesterday was negative. He was tx with percocet with improvement. Today he still c/o of the same back pain.   PCP is Dr. Ouida Sills Cardiologist is [facility]  Past Medical History  Diagnosis Date  . LV dysfunction      ejection fraction 45-50%  . Rheumatic heart disease      status post St. Jude aortic valve Starr-Edwards mitral valve  replacement  . Permanent atrial fibrillation   . Cerebral embolism     Secondary to subtherapeutic INR  . Abdominal aortic aneurysm     small aneurysm  . Hypertension   . Rheumatoid arthritis(714.0)     Severe requiring steroid therapy  . Gastric erosions      resolved  . Cholangitis     Complicated by Escherichia coli bacteremia without endocarditis status post ERCP and sphincterotomy.  . Chronic anticoagulation      INR 3.5-4.5  . Stroke    History reviewed. No pertinent past surgical history.-aortic and mitral mechanical valves per wife at bedside. History reviewed. No pertinent family history. History  Substance Use Topics  . Smoking status: Former Smoker -- 1.00 packs/day for 25 years    Types: Cigarettes    Quit date: 12/01/1973  . Smokeless tobacco: Never Used  . Alcohol Use: Not on file    Review of Systems  Respiratory: Negative for shortness of breath.   Cardiovascular: Negative for chest pain.  Gastrointestinal: Positive for nausea, vomiting, abdominal pain, diarrhea, blood in stool and hematochezia. Negative for hematemesis.  Musculoskeletal: Positive for back pain.  Neurological: Positive for weakness.  All other systems reviewed and are negative.    Allergies  Codeine and Penicillins  Home Medications   Current Outpatient Rx  Name  Route  Sig  Dispense  Refill  . aspirin EC 81 MG tablet   Oral   Take 81 mg by mouth every  morning.          . carvedilol (COREG) 3.125 MG tablet   Oral   Take 3.125 mg by mouth 2 (two) times daily with a meal.         . furosemide (LASIX) 20 MG tablet   Oral   Take 20-40 mg by mouth 2 (two) times daily. Takes two tablets in the morning and one tablet in the evening         . HYDROcodone-acetaminophen (NORCO/VICODIN) 5-325 MG per tablet   Oral   Take 0.5-1 tablets by mouth every 6 (six) hours as needed for moderate pain.         Marland Kitchen levothyroxine (SYNTHROID, LEVOTHROID) 125 MCG tablet   Oral   Take 1 tablet  by mouth every morning.          . Menthol 6.5 % PTCH   Apply externally   Apply 1 patch topically as needed (for pain).         . methocarbamol (ROBAXIN) 500 MG tablet   Oral   Take 250-500 mg by mouth 2 (two) times daily as needed for muscle spasms.         . Multiple Vitamin (MULTIVITAMIN WITH MINERALS) TABS tablet   Oral   Take 1 tablet by mouth daily.         Marland Kitchen oxyCODONE-acetaminophen (PERCOCET/ROXICET) 5-325 MG per tablet   Oral   Take 2 tablets by mouth every 4 (four) hours as needed for severe pain.   10 tablet   0   . potassium chloride (K-DUR) 10 MEQ tablet   Oral   Take 1 tablet (10 mEq total) by mouth every evening.   30 tablet   0   . predniSONE (DELTASONE) 5 MG tablet   Oral   Take 1 tablet by mouth every morning.          . ramipril (ALTACE) 5 MG capsule   Oral   Take 1 capsule by mouth every morning.          . terazosin (HYTRIN) 2 MG capsule   Oral   Take 2 mg by mouth every evening.          . traMADol-acetaminophen (ULTRACET) 37.5-325 MG per tablet   Oral   Take 1 tablet by mouth 4 (four) times daily.          Marland Kitchen triamcinolone (KENALOG) 0.1 % cream   Topical   Apply 1 application topically 2 (two) times daily as needed.          . warfarin (COUMADIN) 5 MG tablet   Oral   Take 5 mg by mouth daily. TAKE 1/2 TABLET (2.5mg  total) on M-W-F and takes one tablet (5mg  total) on all other days          Triage Vitals: BP 111/88  Pulse 63  Temp(Src) 99.6 F (37.6 C) (Oral)  Resp 18  SpO2 95% BP 109/47  Pulse 88  Temp(Src) 98.9 F (37.2 C) (Oral)  Resp 25  SpO2 100%   Physical Exam  Nursing note and vitals reviewed.  CONSTITUTIONAL: elderly and frail  HEAD: Normocephalic/atraumatic EYES: EOMI/PERRL ENMT: Mucous membranes dry NECK: supple no meningeal signs SPINE:entire spine nontender CV: S1/S2 noted, mechanical click noted  LUNGS: Lungs are clear to auscultation bilaterally, no apparent distress ABDOMEN: soft,  nontender, no rebound or guarding GU:no cva tenderness, no blood or melena noted in the rectal vault, hemoccult positive stool, no external hemorrhoids noted, chaperone present NEURO: Pt is  awake/alert, moves all extremitiesx4 EXTREMITIES: pulses normal, full ROM SKIN: warm, color normal PSYCH: no abnormalities of mood noted  ED Course  Procedures   Medications  sodium chloride 0.9 % bolus 250 mL (0 mLs Intravenous Stopped 03/04/14 2036)    DIAGNOSTIC STUDIES: Oxygen Saturation is 95% on RA, adequate by my interpretation.    COORDINATION OF CARE: 8:03 PM-Pt denies having any objections to receiving a blood transfusion if necessary. Discussed treatment plan which includes CBC panel, CMP and IV fluids with pt and family at bedside and all agreed to plan. Advised that pt's condition may require hospitalization for observation and all agreed.   9:00 PM- Pt rechecked and is resting comfortably. Informed of labs showing dehydration and INR that is out of range. BP is 109/47. Re-discussed admission with pt, wife and son and all are in agreeable. Currently stable and does not appear to be acute GI bleed D/w dr Rito Ehrlich, will admit   Labs Review Labs Reviewed  BASIC METABOLIC PANEL - Abnormal; Notable for the following:    Sodium 136 (*)    Potassium 3.6 (*)    Chloride 95 (*)    Glucose, Bld 197 (*)    Creatinine, Ser 1.90 (*)    GFR calc non Af Amer 32 (*)    GFR calc Af Amer 37 (*)    All other components within normal limits  CBC WITH DIFFERENTIAL - Abnormal; Notable for the following:    WBC 13.0 (*)    RBC 4.02 (*)    Neutrophils Relative % 86 (*)    Neutro Abs 11.2 (*)    Lymphocytes Relative 6 (*)    All other components within normal limits  PROTIME-INR - Abnormal; Notable for the following:    Prothrombin Time 44.2 (*)    INR 4.97 (*)    All other components within normal limits  POC OCCULT BLOOD, ED  TYPE AND SCREEN     MDM   Final diagnoses:  Dehydration   Rectal bleeding  Vomiting and diarrhea     Nursing notes including past medical history and social history reviewed and considered in documentation Labs/vital reviewed and considered Previous records reviewed and considered   I personally performed the services described in this documentation, which was scribed in my presence. The recorded information has been reviewed and is accurate.        Joya Gaskins, MD 03/04/14 2138

## 2014-03-04 NOTE — ED Notes (Signed)
Pt called EMS after episode of red blood per rectum this evening. Pt also c/o right sided upper back pain x 1 week.

## 2014-03-04 NOTE — H&P (Signed)
[facility] History and Physical  [name] RSW:[id number] DOB: 01/19/1932 DOA: 03/04/2014  Referring physician: Maurine Simmering, ER physician PCP: Carylon Perches, MD   Chief Complaint: Diarrhea  HPI: [name] is a 80 y.o. male  With past medical history of diastolic heart failure, mechanical aortic and mitral valve on chronic anticoagulation and rheumatoid arthritis who for the last several days has been having nausea with eating and then today started having a number of runs of diarrhea and a few episodes of nausea and vomiting. Following the third episode of diarrhea, patient noted some bright red blood in his stool and came into the emergency room.  Patient had been seen the day prior complaining of back pain. Workup was negative and felt to be musculoskeletal and patient was sent home. This time, patient was noted to have a white blood cell count of 13 and a creatinine of 1.9, both of which were normal yesterday. His INR was also elevated at 4.97. His hemoglobin was stable at 13.1, although there may be some mild heme concentration noted. Because of his elevated INR, I requested a BNP which was elevated at 2600. Hospitalists were called for further evaluation and admission.  Review of Systems:  Patient seen down emergency room. Currently his nausea is a little bit better. He has not had a diarrhea episode for several hours. He denies any headaches, vision changes or dysphagia. Denies any chest pain, palpitations, shortness of breath, wheeze, cough. Does complain of generalized nonspecific abdominal pain. Denies any hematuria or dysuria. Denies any constipation. Was having diarrhea earlier. He does have chronic rheumatoid arthritis changes in his hands and problems with arthritis in his knees. These are chronic and nothing acute. His review of systems otherwise negative.  Past Medical History  Diagnosis Date  . LV dysfunction      ejection fraction 45-50%  . Rheumatic heart  disease      status post St. Jude aortic valve Starr-Edwards mitral valve replacement  . Permanent atrial fibrillation   . Cerebral embolism     Secondary to subtherapeutic INR  . Abdominal aortic aneurysm     small aneurysm  . Hypertension   . Rheumatoid arthritis(714.0)     Severe requiring steroid therapy  . Gastric erosions      resolved  . Cholangitis     Complicated by Escherichia coli bacteremia without endocarditis status post ERCP and sphincterotomy.  . Chronic anticoagulation      INR 3.5-4.5  . Stroke    History reviewed. No pertinent past surgical history. Social History:  reports that he quit smoking about 40 years ago. His smoking use included Cigarettes. He has a 25 pack-year smoking history. He has never used smokeless tobacco. His alcohol and drug histories are not on file.  Allergies  Allergen Reactions  . Codeine   . Penicillins    Family history: Hypertension, CAD  Prior to Admission medications   Medication Sig Start Date End Date Taking? Authorizing Provider  aspirin EC 81 MG tablet Take 81 mg by mouth every morning.     Historical Provider, MD  carvedilol (COREG) 3.125 MG tablet Take 3.125 mg by mouth 2 (two) times daily with a meal.    Historical Provider, MD  furosemide (LASIX) 20 MG tablet Take 20-40 mg by mouth 2 (two) times daily. Takes two tablets in the morning and one tablet in the evening    Historical Provider, MD  HYDROcodone-acetaminophen (NORCO/VICODIN) 5-325 MG per tablet Take 0.5-1 tablets by mouth  every 6 (six) hours as needed for moderate pain.    Historical Provider, MD  levothyroxine (SYNTHROID, LEVOTHROID) 125 MCG tablet Take 1 tablet by mouth every morning.  05/26/11   Historical Provider, MD  Menthol 6.5 % PTCH Apply 1 patch topically as needed (for pain).    Historical Provider, MD  methocarbamol (ROBAXIN) 500 MG tablet Take 250-500 mg by mouth 2 (two) times daily as needed for muscle spasms.    Historical Provider, MD  Multiple  Vitamin (MULTIVITAMIN WITH MINERALS) TABS tablet Take 1 tablet by mouth daily.    Historical Provider, MD  oxyCODONE-acetaminophen (PERCOCET/ROXICET) 5-325 MG per tablet Take 2 tablets by mouth every 4 (four) hours as needed for severe pain. 03/03/14   Toy Baker, MD  potassium chloride (K-DUR) 10 MEQ tablet Take 1 tablet (10 mEq total) by mouth every evening. 03/03/14   Toy Baker, MD  predniSONE (DELTASONE) 5 MG tablet Take 1 tablet by mouth every morning.  05/05/11   Historical Provider, MD  ramipril (ALTACE) 5 MG capsule Take 1 capsule by mouth every morning.  05/26/11   Historical Provider, MD  terazosin (HYTRIN) 2 MG capsule Take 2 mg by mouth every evening.  05/26/11   Historical Provider, MD  traMADol-acetaminophen (ULTRACET) 37.5-325 MG per tablet Take 1 tablet by mouth 4 (four) times daily.  05/12/11   Historical Provider, MD  triamcinolone (KENALOG) 0.1 % cream Apply 1 application topically 2 (two) times daily as needed.     Historical Provider, MD  warfarin (COUMADIN) 5 MG tablet Take 5 mg by mouth daily. TAKE 1/2 TABLET (2.5mg  total) on M-W-F and takes one tablet (5mg  total) on all other days    Historical Provider, MD   Physical Exam: Filed Vitals:   03/04/14 2130  BP: 105/36  Pulse: 86  Temp:   Resp: 21    BP 105/36  Pulse 86  Temp(Src) 98.9 F (37.2 C) (Oral)  Resp 21  Ht 5\' 5"  (1.651 m)  SpO2 100%  General:  Alert and 9 at x3, fatigue, looks about stated age HEENT: Normocephalic and atraumatic, mucous membranes are dry Cardiovascular: Irregular rhythm, rate controlled, 2/6 systolic ejection murmur Lungs: Clear to auscultation bilaterally Abdomen: Soft, minimal distention, tender throughout, hypoactive bowel sounds Extremities: Upper extremities noted chronic rheumatoid arthritic changes. No clubbing or cyanosis of lower extremities, trace pitting edema bilaterally Neuro: No focal deficits           Labs on Admission:  Basic Metabolic Panel:  Recent Labs Lab  03/03/14 1533 03/04/14 1945  NA 135* 136*  K 5.4* 3.6*  CL 95* 95*  CO2 26 25  GLUCOSE 186* 197*  BUN 16 19  CREATININE 0.98 1.90*  CALCIUM 10.5 10.1   Liver Function Tests: No results found for this basename: AST, ALT, ALKPHOS, BILITOT, PROT, ALBUMIN,  in the last 168 hours No results found for this basename: LIPASE, AMYLASE,  in the last 168 hours No results found for this basename: AMMONIA,  in the last 168 hours CBC:  Recent Labs Lab 03/03/14 1533 03/04/14 1945  WBC 9.9 13.0*  NEUTROABS  --  11.2*  HGB 13.6 13.1  HCT 40.9 39.8  MCV 99.3 99.0  PLT 227 206   Cardiac Enzymes: No results found for this basename: CKTOTAL, CKMB, CKMBINDEX, TROPONINI,  in the last 168 hours  BNP (last 3 results)  Recent Labs  03/04/14 1945  PROBNP 2670.0*   CBG: No results found for this basename: GLUCAP,  in  the last 168 hours  Radiological Exams on Admission: Dg Thoracic Spine 2 View  03/03/2014   CLINICAL DATA:  Back pain  EXAM: THORACIC SPINE - 2 VIEW  COMPARISON:  09/10/2007  FINDINGS: Postsurgical changes are again identified. No compression deformities are seen. No paraspinal mass lesion is noted. Diffuse aortic calcifications are seen.  IMPRESSION: No acute abnormality is noted.   Electronically Signed   By: Alcide Clever M.D.   On: 03/03/2014 16:26      Assessment/Plan Principal Problem:   Gastroenteritis or enteritis: Passing enteritis. No signs of bacterial infection. Symptomatic control with IV fluids, nausea and pain medication Active Problems:   Atypical depressive disorder   MITRAL VALVE REPLACEMENT, HX OF   AORTIC VALVE REPLACEMENT, HX OF colon INR supratherapeutic, secondary coagulopathy. Pharmacy to manage Coumadin    Permanent atrial fibrillation: On chronic Coumadin. Rate control.    Rheumatoid arthritis(714.0): Continue daily prednisone. The patient does not have significant improvement by tomorrow, start stress dose steroids    Acute on chronic  systolic/Diastolic CHF: Given renal failure and lower blood pressures, gentle IV fluids for now. Follow daily weights and recheck BNP in the next few days. May need to restart gentle hydration the creatinine normalized.    Vomiting and diarrhea: Secondary gastroenteritis    Hypothyroid continue Synthroid, checking TSH    Rectal bleeding: Likely secondary to diarrhea, may be hemorrhoidal. No evidence of significant hemoglobin drop. Continue to monitor    ARF (acute renal failure): Secondary to dehydration and diarrhea. Gentle IV fluids.   Coagulopathy: Suspect secondary to hepatic congestion from diastolic heart failure. Hydrating for now. Continue to follow INR.   Code Status: Patient stated DO NOT RESUSCITATE which wife confirmed  Family Communication: Wife at the bedside.  Disposition Plan: Patient may be able to be discharged in the next few days  Time spent: 40 minutes  Hollice Espy [facility] Pager [phone]

## 2014-03-05 ENCOUNTER — Observation Stay (HOSPITAL_COMMUNITY): Payer: Medicare Other

## 2014-03-05 DIAGNOSIS — K625 Hemorrhage of anus and rectum: Secondary | ICD-10-CM

## 2014-03-05 DIAGNOSIS — R1110 Vomiting, unspecified: Secondary | ICD-10-CM

## 2014-03-05 DIAGNOSIS — R197 Diarrhea, unspecified: Secondary | ICD-10-CM

## 2014-03-05 DIAGNOSIS — D689 Coagulation defect, unspecified: Secondary | ICD-10-CM

## 2014-03-05 DIAGNOSIS — E860 Dehydration: Secondary | ICD-10-CM

## 2014-03-05 LAB — COMPREHENSIVE METABOLIC PANEL
ALK PHOS: 52 U/L (ref 39–117)
ALT: 14 U/L (ref 0–53)
AST: 28 U/L (ref 0–37)
Albumin: 3.3 g/dL — ABNORMAL LOW (ref 3.5–5.2)
BUN: 22 mg/dL (ref 6–23)
CO2: 27 meq/L (ref 19–32)
Calcium: 9.5 mg/dL (ref 8.4–10.5)
Chloride: 100 mEq/L (ref 96–112)
Creatinine, Ser: 2.04 mg/dL — ABNORMAL HIGH (ref 0.50–1.35)
GFR, EST AFRICAN AMERICAN: 34 mL/min — AB (ref >90)
GFR, EST NON AFRICAN AMERICAN: 29 mL/min — AB (ref >90)
Glucose, Bld: 132 mg/dL — ABNORMAL HIGH (ref 70–99)
POTASSIUM: 3.8 meq/L (ref 3.7–5.3)
SODIUM: 138 meq/L (ref 137–147)
TOTAL PROTEIN: 6.2 g/dL (ref 6.0–8.3)
Total Bilirubin: 1.5 mg/dL — ABNORMAL HIGH (ref 0.3–1.2)

## 2014-03-05 LAB — CBC
HCT: 37.3 % — ABNORMAL LOW (ref 39.0–52.0)
HEMOGLOBIN: 12.4 g/dL — AB (ref 13.0–17.0)
MCH: 32.9 pg (ref 26.0–34.0)
MCHC: 33.2 g/dL (ref 30.0–36.0)
MCV: 98.9 fL (ref 78.0–100.0)
Platelets: 187 10*3/uL (ref 150–400)
RBC: 3.77 MIL/uL — AB (ref 4.22–5.81)
RDW: 14.3 % (ref 11.5–15.5)
WBC: 11.2 10*3/uL — ABNORMAL HIGH (ref 4.0–10.5)

## 2014-03-05 LAB — TSH: TSH: 3.05 u[IU]/mL (ref 0.350–4.500)

## 2014-03-05 LAB — PROTIME-INR
INR: 4.52 — ABNORMAL HIGH (ref 0.00–1.49)
Prothrombin Time: 41.1 seconds — ABNORMAL HIGH (ref 11.6–15.2)

## 2014-03-05 LAB — LIPASE, BLOOD: Lipase: 8 U/L — ABNORMAL LOW (ref 11–59)

## 2014-03-05 MED ORDER — IOHEXOL 300 MG/ML  SOLN
50.0000 mL | Freq: Once | INTRAMUSCULAR | Status: AC | PRN
  Administered 2014-03-05: 50 mL via ORAL

## 2014-03-05 MED ORDER — ENSURE COMPLETE PO LIQD
237.0000 mL | Freq: Three times a day (TID) | ORAL | Status: DC
  Administered 2014-03-05 – 2014-03-10 (×11): 237 mL via ORAL

## 2014-03-05 MED ORDER — PANTOPRAZOLE SODIUM 40 MG PO TBEC: 40.0000 mg | DELAYED_RELEASE_TABLET | Freq: Every day | ORAL | Status: DC

## 2014-03-05 MED ORDER — PANTOPRAZOLE SODIUM 40 MG PO TBEC
40.0000 mg | DELAYED_RELEASE_TABLET | Freq: Two times a day (BID) | ORAL | Status: DC
  Administered 2014-03-05 – 2014-03-10 (×10): 40 mg via ORAL
  Filled 2014-03-05 (×10): qty 1

## 2014-03-05 MED ORDER — SODIUM CHLORIDE 0.9 % IV BOLUS (SEPSIS)
250.0000 mL | Freq: Once | INTRAVENOUS | Status: AC
  Administered 2014-03-05: 250 mL via INTRAVENOUS

## 2014-03-05 MED ORDER — ONDANSETRON HCL 4 MG/2ML IJ SOLN
4.0000 mg | Freq: Three times a day (TID) | INTRAMUSCULAR | Status: DC
  Administered 2014-03-05 – 2014-03-09 (×14): 4 mg via INTRAVENOUS
  Filled 2014-03-05 (×16): qty 2

## 2014-03-05 NOTE — Progress Notes (Signed)
Pt now having heavier blood flow from rectum, moderate in amount. BP was assessed to be in the 80s. MD notified. 250 bolus given. Will continue to monitor.

## 2014-03-05 NOTE — Consult Note (Signed)
Referring Provider: No ref. provider found Primary Care Physician:  Carylon Perches, MD Primary Gastroenterologist:  Jonette Eva  Reason for Consultation:  Bloody diarrhea  HPI:  PT UNABLE TO PROVIDE HISTORY DUE TO COGNITIVE DYSFUNCTION. WIFE OF ALMOST 55 YEARS PROVIDES HISTORY. PT HAS HAD ANOREXIA, SEVERE BACK PAIN, AND NAUSEA/ABDOMINAL PAIN AFTER EATING FOR 1 WEEK. INITIALLY PRESCRIBED ULTRAM/MUSCLE RELAXER. BROUGHT TO ED LAST WEEK DUE TO AGITATION/SEVERE BACK PAIN-PLAIN FILMS: NO ACUTE BONY DISORDER. YESTERDAY PT HAD SUDDEN ONSET OF VOMITING, WORSENING ABDOMINAL PAIN, AND PASSED A LARGE AMOUNT OF BLOOD/CLOTS ASSOCIATED WITH WATERY DIARRHEA AFTER BREAKFAST. LAST VOMITED YESTERDAY. TAKES PREDNISONE DAILY. NO ABX. PT HAS NOT HAD melena, constipation, abd pain, problems swallowing, OR heartburn. PT HAS NEVER HAD EGD/TCS.  Past Medical History  Diagnosis Date  . LV dysfunction      ejection fraction 45-50%  . Rheumatic heart disease       status post St. Jude aortic valve Starr-Edwards mitral valve replacement  . Permanent atrial fibrillation   . Cerebral embolism     Secondary to subtherapeutic INR  . Abdominal aortic aneurysm     small aneurysm  . Hypertension   . Rheumatoid arthritis(714.0) AGE 4     Severe requiring steroid therapy  . Gastric erosions      resolved  . Cholangitis     Complicated by Escherichia coli bacteremia without endocarditis status post ERCP and sphincterotomy.  . Chronic anticoagulation      INR 3.5-4.5  . Stroke     PAST SURGICAL HISTORY: 2012 ERCP COMPLICATED BY E COLI BACTEREMIA  Prior to Admission medications   Medication Sig Start Date End Date Taking? Authorizing Provider  furosemide (LASIX) 20 MG tablet Take 20-40 mg by mouth 2 (two) times daily. Takes two tablets in the morning and one tablet in the evening   Yes Historical Provider, MD  HYDROcodone-acetaminophen (NORCO/VICODIN) 5-325 MG per tablet Take 0.5-1 tablets by mouth every 6 (six) hours as  needed for moderate pain.   Yes Historical Provider, MD  carvedilol (COREG) 3.125 MG tablet Take 3.125 mg by mouth 2 (two) times daily with a meal.    Historical Provider, MD  levothyroxine (SYNTHROID, LEVOTHROID) 125 MCG tablet Take 1 tablet by mouth every morning.  05/26/11   Historical Provider, MD  Menthol 6.5 % PTCH Apply 1 patch topically as needed (for pain).    Historical Provider, MD  methocarbamol (ROBAXIN) 500 MG tablet Take 250-500 mg by mouth 2 (two) times daily as needed for muscle spasms.    Historical Provider, MD  Multiple Vitamin (MULTIVITAMIN WITH MINERALS) TABS tablet Take 1 tablet by mouth daily.    Historical Provider, MD  oxyCODONE-acetaminophen (PERCOCET/ROXICET) 5-325 MG per tablet Take 2 tablets by mouth every 4 (four) hours as needed for severe pain. 03/03/14   Toy Baker, MD  potassium chloride (K-DUR) 10 MEQ tablet Take 1 tablet (10 mEq total) by mouth every evening. 03/03/14   Toy Baker, MD  predniSONE (DELTASONE) 5 MG tablet Take 1 tablet by mouth every morning.  05/05/11   Historical Provider, MD  ramipril (ALTACE) 5 MG capsule Take 1 capsule by mouth every morning.  05/26/11   Historical Provider, MD  terazosin (HYTRIN) 2 MG capsule Take 2 mg by mouth every evening.  05/26/11   Historical Provider, MD  traMADol-acetaminophen (ULTRACET) 37.5-325 MG per tablet Take 1 tablet by mouth 4 (four) times daily.  05/12/11   Historical Provider, MD  triamcinolone (KENALOG) 0.1 % cream Apply 1 application  topically 2 (two) times daily as needed.     Historical Provider, MD  warfarin (COUMADIN) 5 MG tablet Take 5 mg by mouth daily. TAKE 1/2 TABLET (2.5mg  total) on M-W-F and takes one tablet (5mg  total) on all other days    Historical Provider, MD    Current Facility-Administered Medications  Medication Dose Route Frequency Provider Last Rate Last Dose  . 0.9 %  sodium chloride infusion   Intravenous Continuous , MD 50 mL/hr at 03/04/14 2357    . acetaminophen  (TYLENOL) tablet 650 mg  650 mg Oral Q6H PRN 05/04/14, MD       Or  . acetaminophen (TYLENOL) suppository 650 mg  650 mg Rectal Q6H PRN Hollice Espy, MD      . levothyroxine (SYNTHROID, LEVOTHROID) tablet 125 mcg  125 mcg Oral QAC breakfast Hollice Espy, MD   125 mcg at 03/04/14 2356  . morphine 2 MG/ML injection 1 mg  1 mg Intravenous Q4H PRN 05/04/14, MD      . ondansetron (ZOFRAN) tablet 4 mg  4 mg Oral Q6H PRN Hollice Espy, MD       Or  . ondansetron (ZOFRAN) injection 4 mg  4 mg Intravenous Q6H PRN Hollice Espy, MD      . pantoprazole (PROTONIX) EC tablet 40 mg  40 mg Oral Daily Hollice Espy, MD      . predniSONE (DELTASONE) tablet 5 mg  5 mg Oral q morning - 10a Carylon Perches, MD        Allergies as of 03/04/2014 - Review Complete 03/04/2014  Allergen Reaction Noted  . Codeine  10/16/2008  . Penicillins  10/16/2008    FAMILY HISTORY: NO COLON CANCER/POLYPS  History   Social History  . Marital Status: 10/18/2008 END OF [employer]IL         Number of Children: NONE  .     Occupational History  . Not on file.   Social History Main Topics  . Smoking status: Former Smoker -- 1.00 packs/day for 25 years    Types: Cigarettes    Quit date: 12/01/1973  . Smokeless tobacco: Never Used  . Alcohol Use: Not on file  . Drug Use: Not on file  . Sexual Activity: Not on file   Other Topics Concern  . Not on file   Social History Narrative  . No narrative on file    Review of Systems: LIMITED DUE TO PT COGNITIVE DYSFUNCTION. OTHERWISE, PER HPI ALL SYSTEMS ARE NEGATIVE.  Vitals: Blood pressure 109/71, pulse 92, temperature 100 F (37.8 C), temperature source Oral, resp. rate 22, height 5\' 5"  (1.651 m), weight 150 lb 9.2 oz (68.3 kg), SpO2 97.00%.  Physical Exam: General:   AROUSABLE and cooperative in NAD Head:  Normocephalic and atraumatic. Mouth:  Deformities of hands bilaterally  Neck:  Supple; no masses. Lungs:  Course  breath sound bilaterally. INTERMITTENT WHEEZES. No acute distress. Heart:  Regular rhythm; MID-SYSTOLIC CLICKS  Abdomen:  Soft, MILD TENDERNESS TO PALPATION, nondistended. No masses noted. Normal bowel sounds, without guarding, and without rebound.   Msk:  Symmetrical witht gross deformities.  Extremities:  Without edema. Neurologic:  AROUSABLE. NO  NEW FOCAL DEFICITS Cervical Nodes:  No significant cervical adenopathy.  Lab Results:  Recent Labs  03/03/14 1533 03/04/14 1945 03/05/14 0620  WBC 9.9 13.0* 11.2*  HGB 13.6 13.1 12.4*  HCT 40.9 39.8 37.3*  PLT 227 206 187   BMET  Recent Labs  03/04/14 1945 03/05/14 0620  NA 136* 138  K 3.6* 3.8  CL 95* 100  CO2 25 27  GLUCOSE 197* 132*  BUN 19 22  CREATININE 1.90* 2.04*  CALCIUM 10.1 9.5   LFT  Recent Labs  03/05/14 0620  PROT 6.2  ALBUMIN 3.3*  AST 28  ALT 14  ALKPHOS 52  BILITOT 1.5*   Impression: ADMITTED WITH NAUSEA/VOMITING/ABD PAIN/ANOREXIA/BACK PAIN FOLLOWED BY ABDOMINAL PAIN AND BLOODY DIARRHEA MOST LIKELY DUE TO ISCHEMIC COLITIS, LESS LIKELY C DIFF COLITIS OR FOOD BORNE ILLNESS. NO OBVIOUS SOURCE FOR ANOREXIA/NAUSEA/BACK PAIN. DIFFERENTIAL DIAGNOSIS INCLUDES OCCULT MALIGNANCY, LESS LIKELY PANCREATITIS, EROSIVE ESOPHAGITIS, OR H PYLORI GASTRITIS. PT CHRONICALLY ANTI-COAGULATED FOR MECHANICAL HEART VALVES.  Plan: 1. PT WILL NEED TCS/EGD-POSSIBLE DILATION WHEN INR < 1.5 2. CARDIOLOGY CONSULT REGARDING TRANSITION TO HEPARIN GTT AND PRECAUTIONS REGARDING POLYP REMOVAL/ESOPHAGEAL DILATION 3. CHECK LIPASE TODAY 4. CT ABD/PELVIS TODAY FOR ABD PAIN, VOMITING, ANOREXIA, BACK PAIN, BLOODY DIARRHEA. 5. ZOFRAN ATC 6. PROTONIX BID 7. FULL LIQUID DIET/ENSURE TID 8. CHECK GI PATHOGEN PANEL.   LOS: 1 day   [facility]  03/05/2014, 9:43 AM

## 2014-03-05 NOTE — Progress Notes (Addendum)
ANTICOAGULATION CONSULT NOTE - Initial Consult  Pharmacy Consult for Coumadin Indication: AVR/MVR + Afib  Allergies  Allergen Reactions  . Codeine Nausea And Vomiting  . Penicillins Nausea And Vomiting    Patient Measurements: Height: 5\' 5"  (165.1 cm) Weight: 150 lb 9.2 oz (68.3 kg) IBW/kg (Calculated) : 61.5  Vital Signs: Temp: 100 F (37.8 C) (04/05 0352) Temp src: Oral (04/05 0352) BP: 109/71 mmHg (04/05 0425) Pulse Rate: 92 (04/05 0425)  Labs:  Recent Labs  03/03/14 1533 03/04/14 1945 03/05/14 0620 03/05/14 0953  HGB 13.6 13.1 12.4*  --   HCT 40.9 39.8 37.3*  --   PLT 227 206 187  --   LABPROT  --  44.2*  --  41.1*  INR  --  4.97*  --  4.52*  CREATININE 0.98 1.90* 2.04*  --     Estimated Creatinine Clearance: 25.1 ml/min (by C-G formula based on Cr of 2.04).   Medical History: Past Medical History  Diagnosis Date  . LV dysfunction      ejection fraction 45-50%  . Rheumatic heart disease      status post St. Jude aortic valve Starr-Edwards mitral valve replacement  . Permanent atrial fibrillation   . Cerebral embolism     Secondary to subtherapeutic INR  . Abdominal aortic aneurysm     small aneurysm  . Hypertension   . Rheumatoid arthritis(714.0)     Severe requiring steroid therapy  . Gastric erosions      resolved  . Cholangitis     Complicated by Escherichia coli bacteremia without endocarditis status post ERCP and sphincterotomy.  . Chronic anticoagulation      INR 3.5-4.5  . Stroke     Medications:  Prescriptions prior to admission  Medication Sig Dispense Refill  . furosemide (LASIX) 20 MG tablet Take 20-40 mg by mouth 2 (two) times daily. Takes two tablets in the morning and one tablet in the evening      . HYDROcodone-acetaminophen (NORCO/VICODIN) 5-325 MG per tablet Take 0.5-1 tablets by mouth every 6 (six) hours as needed for moderate pain.      . carvedilol (COREG) 3.125 MG tablet Take 3.125 mg by mouth 2 (two) times daily with a  meal.      . levothyroxine (SYNTHROID, LEVOTHROID) 125 MCG tablet Take 1 tablet by mouth every morning.       . Menthol 6.5 % PTCH Apply 1 patch topically as needed (for pain).      . methocarbamol (ROBAXIN) 500 MG tablet Take 250-500 mg by mouth 2 (two) times daily as needed for muscle spasms.      . Multiple Vitamin (MULTIVITAMIN WITH MINERALS) TABS tablet Take 1 tablet by mouth daily.      06-10-1981 oxyCODONE-acetaminophen (PERCOCET/ROXICET) 5-325 MG per tablet Take 2 tablets by mouth every 4 (four) hours as needed for severe pain.  10 tablet  0  . potassium chloride (K-DUR) 10 MEQ tablet Take 1 tablet (10 mEq total) by mouth every evening.  30 tablet  0  . predniSONE (DELTASONE) 5 MG tablet Take 1 tablet by mouth every morning.       . ramipril (ALTACE) 5 MG capsule Take 1 capsule by mouth every morning.       . terazosin (HYTRIN) 2 MG capsule Take 2 mg by mouth every evening.       . traMADol-acetaminophen (ULTRACET) 37.5-325 MG per tablet Take 1 tablet by mouth 4 (four) times daily.       03-14-2000  triamcinolone (KENALOG) 0.1 % cream Apply 1 application topically 2 (two) times daily as needed.       . warfarin (COUMADIN) 5 MG tablet Take 5 mg by mouth daily. TAKE 1/2 TABLET (2.5mg  total) on M-W-F and takes one tablet (5mg  total) on all other days        Assessment: 80 yo M on chronic warfarin for mechanical valve + Afib.  Home dose listed above.  INR is slightly above goal range on admission.  He is admitted with anorexia, nausea, & vomiting which likely explains elevation.  He is noted to have bloody stools.  Noted that GI recommends EGD when INR<1.5 and bridge with heparin infusion.  Would NOT recommend Vit K for reversal in patient with artifical valve unless procedure becomes urgent.   Goal of Therapy:  INR 3-5-4.5 as documented on anticoagulation flowsheet/per MD order   Plan:  Hold Coumadin  Daily INR F/U plans for procedure, heparin bridge  96 03/05/2014,11:02 AM

## 2014-03-06 ENCOUNTER — Observation Stay (HOSPITAL_COMMUNITY): Payer: Medicare Other

## 2014-03-06 DIAGNOSIS — K529 Noninfective gastroenteritis and colitis, unspecified: Secondary | ICD-10-CM | POA: Diagnosis present

## 2014-03-06 DIAGNOSIS — Z7901 Long term (current) use of anticoagulants: Secondary | ICD-10-CM

## 2014-03-06 DIAGNOSIS — I359 Nonrheumatic aortic valve disorder, unspecified: Secondary | ICD-10-CM

## 2014-03-06 DIAGNOSIS — I4891 Unspecified atrial fibrillation: Secondary | ICD-10-CM

## 2014-03-06 DIAGNOSIS — I059 Rheumatic mitral valve disease, unspecified: Secondary | ICD-10-CM

## 2014-03-06 LAB — CBC
HEMATOCRIT: 34.3 % — AB (ref 39.0–52.0)
HEMOGLOBIN: 11.8 g/dL — AB (ref 13.0–17.0)
MCH: 33.7 pg (ref 26.0–34.0)
MCHC: 34.4 g/dL (ref 30.0–36.0)
MCV: 98 fL (ref 78.0–100.0)
Platelets: 177 10*3/uL (ref 150–400)
RBC: 3.5 MIL/uL — ABNORMAL LOW (ref 4.22–5.81)
RDW: 14.5 % (ref 11.5–15.5)
WBC: 11.4 10*3/uL — ABNORMAL HIGH (ref 4.0–10.5)

## 2014-03-06 LAB — BASIC METABOLIC PANEL
BUN: 21 mg/dL (ref 6–23)
CO2: 24 mEq/L (ref 19–32)
Calcium: 9.1 mg/dL (ref 8.4–10.5)
Chloride: 100 mEq/L (ref 96–112)
Creatinine, Ser: 1.33 mg/dL (ref 0.50–1.35)
GFR calc non Af Amer: 49 mL/min — ABNORMAL LOW (ref >90)
GFR, EST AFRICAN AMERICAN: 57 mL/min — AB (ref >90)
GLUCOSE: 141 mg/dL — AB (ref 70–99)
Potassium: 3.5 mEq/L — ABNORMAL LOW (ref 3.7–5.3)
Sodium: 137 mEq/L (ref 137–147)

## 2014-03-06 LAB — GI PATHOGEN PANEL BY PCR, STOOL
C difficile toxin A/B: NEGATIVE
CAMPYLOBACTER BY PCR: NEGATIVE
Cryptosporidium by PCR: NEGATIVE
E coli (ETEC) LT/ST: NEGATIVE
E coli (STEC): NEGATIVE
E coli 0157 by PCR: NEGATIVE
G lamblia by PCR: NEGATIVE
NOROVIRUS G1/G2: NEGATIVE
Rotavirus A by PCR: NEGATIVE
Salmonella by PCR: NEGATIVE
Shigella by PCR: NEGATIVE

## 2014-03-06 LAB — PROTIME-INR
INR: 3.73 — AB (ref 0.00–1.49)
PROTHROMBIN TIME: 35.5 s — AB (ref 11.6–15.2)

## 2014-03-06 MED ORDER — WARFARIN - PHYSICIAN DOSING INPATIENT
Freq: Every day | Status: DC
  Administered 2014-03-06: 18:00:00

## 2014-03-06 MED ORDER — CARVEDILOL 3.125 MG PO TABS
3.1250 mg | ORAL_TABLET | Freq: Two times a day (BID) | ORAL | Status: DC
  Administered 2014-03-06 – 2014-03-10 (×8): 3.125 mg via ORAL
  Filled 2014-03-06 (×9): qty 1

## 2014-03-06 MED ORDER — HEPARIN (PORCINE) IN NACL 100-0.45 UNIT/ML-% IJ SOLN
800.0000 [IU]/h | INTRAMUSCULAR | Status: DC
  Administered 2014-03-06: 800 [IU]/h via INTRAVENOUS
  Filled 2014-03-06: qty 250

## 2014-03-06 MED ORDER — WARFARIN SODIUM 2 MG PO TABS
4.0000 mg | ORAL_TABLET | Freq: Once | ORAL | Status: AC
  Administered 2014-03-06: 4 mg via ORAL
  Filled 2014-03-06: qty 2

## 2014-03-06 MED ORDER — POTASSIUM CHLORIDE CRYS ER 20 MEQ PO TBCR
20.0000 meq | EXTENDED_RELEASE_TABLET | Freq: Three times a day (TID) | ORAL | Status: AC
  Administered 2014-03-06 – 2014-03-07 (×6): 20 meq via ORAL
  Filled 2014-03-06 (×3): qty 1
  Filled 2014-03-06 (×2): qty 2
  Filled 2014-03-06 (×2): qty 1

## 2014-03-06 NOTE — Progress Notes (Signed)
NAMEDEVAN, [name]                ACCOUNT NO.:  [id number]  MEDICAL RECORD NO.:  [id number]  LOCATION:                                 FACILITY:  PHYSICIAN:  Kingsley Callander. Ouida Sills, MD       DATE OF BIRTH:  November 21, 1932  DATE OF PROCEDURE:  03/05/2014 DATE OF DISCHARGE:                                PROGRESS NOTE   SUBJECTIVE:  The patient was admitted yesterday after presenting with nausea, vomiting, and diarrhea.  He has had bright red rectal bleeding. Hemoglobin was 13.1 and INR was 4.97.  He is anticoagulated because of artificial mitral and aortic valve replacements.  BUN and creatinine had increased to 19 and 1.9 from 16 and 0.9 the day before when he had been seen in the ER.  Coumadin has been held.  He has had some additional bleeding overnight.  He denies any abdominal pain this morning.  He denies any nausea now.  He has moderate dementia, and is a limited historian.  His wife is not with him at this time.  OBJECTIVE:  VITAL SIGNS:  Temperature 100, pulse 92, respirations 22, blood pressure 109/71, oxygen saturation 97% on 2 L. LUNGS:  Clear. HEART:  Irregularly irregular with prosthetic valve sounds. ABDOMEN:  Soft and nontender with no palpable organomegaly. EXTREMITIES:  Reveal marked arthritic changes from chronic RA.  No edema. NEURO:  At baseline.  IMPRESSION/PLAN: 1. Rectal bleeding, possible recent gastroenteritis.  White count is     down from 13 to 11.2.  Hold Coumadin.  Check INR daily.  Consult     Gastroenterology.  He is currently hemodynamically stable.     Antihypertensives are being held.  Rise in BUN and creatinine are     concerning, and will be treated with gentle IV hydration.  He has     not shown signs of volume overload.  His proBNP was at 2670 on     admission.  Hemoglobin is 12.4 today.  Case has been discussed with     Dr. Darrick Penna.  We will stop aspirin also, and we will treat     empirically with Protonix. 2. Rheumatic heart disease, status post  mitral and aortic valve     replacements. 3. Dementia. 4. History of stroke. 5. Hypertension.  Hold ramipril, carvedilol, Lasix, and terazosin for     now. 6. Hypothyroidism.  Continue levothyroxine. 7. Rheumatoid arthritis.  Continue low-dose prednisone.     Kingsley Callander. Ouida Sills, MD     ROF/MEDQ  D:  03/05/2014  T:  03/05/2014  Job:  [id number]

## 2014-03-06 NOTE — Progress Notes (Signed)
UR completed. Patient changed to inpatient- requiring IV heparin gtt  

## 2014-03-06 NOTE — Care Management Note (Addendum)
    Page 1 of 2   03/10/2014     12:50:32 PM   CARE MANAGEMENT NOTE 03/10/2014  Patient:  [name], [name]   Account Number:  [id number]  Date Initiated:  03/06/2014  Documentation initiated by:  Sharrie Rothman  Subjective/Objective Assessment:   Pt admitted from home with colitis. Pt lives with his wife and will return home at discharge. Pt has a cane for home use and a walker.  Pts coumadin is followed by Coumadin clinic at [facility]     Action/Plan:   Pts wife would like pt to have a new RW with [facility] and an [facility] RN at discharge for PT/INR draws (per wifes choice). Will arrange [facility] RN at discharge and will have RW delivered to pt prior to discharge.   Anticipated DC Date:  03/10/2014   Anticipated DC Plan:  HOME W HOME HEALTH SERVICES  In-house referral  Clinical Social Worker      DC Planning Services  CM consult      PAC Choice  DURABLE MEDICAL EQUIPMENT  HOME HEALTH   Choice offered to / List presented to:  C-3 Spouse   DME arranged  WALKER - ROLLING           Status of service:  Completed, signed off Medicare Important Message given?  YES (If response is "NO", the following Medicare IM given date fields will be blank) Date Medicare IM given:  03/07/2014 Date Additional Medicare IM given:  03/10/2014  Discharge Disposition:  SKILLED NURSING FACILITY  Per UR Regulation:    If discussed at Long Length of Stay Meetings, dates discussed:   03/09/2014    Comments:  03/10/14 Rosemary Holms RN BSN CM Pt DC'd today. CM called wife who states that she will take pt home and would need the home health previously discussed. CSW called and informed that wife was taking pt to [facility] and would pay privately. Family on their way to get pt and take them to SNF. CM called by Mosie Lukes to advise that wife had appealed DC. CM called wife back to inquire if she had appealed this morning and she said she did because [facility] was going to kick him out and she needed to know he could stay until the  family arrived to take him to [place]. Wife stated she would cancel the appeal once he had left.  03/08/14 Rosemary Holms RN BSN CM 11:35 CM received VM from Jae Dire at [facility], appeal case closed. Pt will have the right to appeal again when DC'd.  03/07/14 Heatherly Stenner Leanord Hawking RN BSN CM Wife concerned about pending DC. Dr. Ouida Sills is cancelling DC to follow tolerance to diet one more day. 2:00 [facility] called regarding a call from wife appealing DC. CM states that pt was not being DC'd. [facility] states that they need a statement on letterhead that pt was not discharged. Faxed received from [facility] sent to Case Management Central office in [place], [place]. 2:30 Aris [place] sending letter to [facility] as requested. 03/07/14 1500 Arlyss Queen, RN BSN CM

## 2014-03-06 NOTE — Progress Notes (Addendum)
REVIEWED. WIFE CONCERNED ABOUT PUTTING PT THROUGH ENDOSCOPY. VOMITING RESOLVED. CONTINUES WITH LOOSE STOOLS.GI PATH PANEL PENDING. EXPLAINED TO WIFE IF WE DO NOT HAVE A GOOD REASON FOR ANOREXIA/NAUSEA/POOR PO INTAKE. HE WILL LIKELY HAVE ANOTHER EPISODE OF ISCHEMIC COLITIS. SHE VOICED HER UNDERSTANDING. AWAIT GI PATH RESULTS AND ORDER H PYLORI SEROLOGY, TREAT IF POSITIVE.

## 2014-03-06 NOTE — Progress Notes (Signed)
NAMERONTAVIOUS, ALBRIGHT                ACCOUNT NO.:  [id number]  MEDICAL RECORD NO.:  [id number]  LOCATION:  A315                          FACILITY:  [facility]  PHYSICIAN:  Kingsley Callander. Ouida Sills, MD       DATE OF BIRTH:  Jun 14, 1932  DATE OF PROCEDURE: DATE OF DISCHARGE:                                PROGRESS NOTE   SUBJECTIVE:  [name] denies any abdominal pain this morning.  He denies any additional rectal bleeding.  He has not had nausea.  He had CT scan yesterday consistent with possible ischemic colitis.  Appreciate GI consult.  OBJECTIVE:  VITAL SIGNS:  Temperature 97.5, pulse 78, respirations 22, blood pressure 121/69. LUNGS:  Clear. HEART:  Prosthetic valve sounds. ABDOMEN:  Soft, nondistended, and nontender with no palpable organomegaly. EXTREMITIES:  No edema. NEUROLOGIC:  Alert and comfortable appearing.  IMPRESSION AND PLAN: 1. Ischemic colitis.  INR is 2.73.  We will restart Coumadin when okay     with GI. 2. Anemia.  Hemoglobin is trended down to 11.8. 3. Status post mitral and aortic valve replacements.  We will consult     with Cardiology. 4. Acute renal failure.  BUN and creatinine are improved at 21 and     1.33, potassium is 3.5, and will be supplemented orally. 5. Hypothyroidism.  His euthyroid with a TSH of 3.05. 6. A GI pathogen panel is pending.     Kingsley Callander. Ouida Sills, MD     ROF/MEDQ  D:  03/06/2014  T:  03/06/2014  Job:  [id number]

## 2014-03-06 NOTE — Progress Notes (Signed)
ANTICOAGULATION CONSULT NOTE  Pharmacy Consult for Heparin Indication: AVR/MVR + Afib  Allergies  Allergen Reactions  . Codeine Nausea And Vomiting  . Penicillins Nausea And Vomiting    Patient Measurements: Height: 5\' 5"  (165.1 cm) Weight: 150 lb 9.2 oz (68.3 kg) IBW/kg (Calculated) : 61.5  Vital Signs: Temp: 99.6 F (37.6 C) (04/06 1256) Temp src: Oral (04/06 1256) BP: 121/69 mmHg (04/06 0656) Pulse Rate: 78 (04/06 0656)  Labs:  Recent Labs  03/04/14 1945 03/05/14 0620 03/05/14 0953 03/06/14 0537  HGB 13.1 12.4*  --  11.8*  HCT 39.8 37.3*  --  34.3*  PLT 206 187  --  177  LABPROT 44.2*  --  41.1* 35.5*  INR 4.97*  --  4.52* 3.73*  CREATININE 1.90* 2.04*  --  1.33    Estimated Creatinine Clearance: 38.5 ml/min (by C-G formula based on Cr of 1.33).   Medical History: Past Medical History  Diagnosis Date  . LV dysfunction      ejection fraction 45-50%  . Rheumatic heart disease      status post St. Jude aortic valve Starr-Edwards mitral valve replacement  . Permanent atrial fibrillation   . Cerebral embolism     Secondary to subtherapeutic INR  . Abdominal aortic aneurysm     small aneurysm  . Hypertension   . Rheumatoid arthritis(714.0)     Severe requiring steroid therapy  . Gastric erosions      resolved  . Cholangitis     Complicated by Escherichia coli bacteremia without endocarditis status post ERCP and sphincterotomy.  . Chronic anticoagulation      INR 3.5-4.5  . Stroke     Medications:  Prescriptions prior to admission  Medication Sig Dispense Refill  . carvedilol (COREG) 3.125 MG tablet Take 3.125 mg by mouth 2 (two) times daily with a meal.      . furosemide (LASIX) 20 MG tablet Take 20-40 mg by mouth 2 (two) times daily. Takes two tablets in the morning and one tablet in the evening      . HYDROcodone-acetaminophen (NORCO/VICODIN) 5-325 MG per tablet Take 0.5-1 tablets by mouth every 6 (six) hours as needed for moderate pain.      06-10-1981  levothyroxine (SYNTHROID, LEVOTHROID) 125 MCG tablet Take 1 tablet by mouth every morning.       . methocarbamol (ROBAXIN) 500 MG tablet Take 250-500 mg by mouth 2 (two) times daily as needed for muscle spasms.      . Multiple Vitamin (MULTIVITAMIN WITH MINERALS) TABS tablet Take 1 tablet by mouth daily.      . potassium chloride (K-DUR) 10 MEQ tablet Take 1 tablet (10 mEq total) by mouth every evening.  30 tablet  0  . predniSONE (DELTASONE) 5 MG tablet Take 1 tablet by mouth every morning.       . ramipril (ALTACE) 5 MG capsule Take 1 capsule by mouth every morning.       . terazosin (HYTRIN) 2 MG capsule Take 2 mg by mouth every evening.       . traMADol-acetaminophen (ULTRACET) 37.5-325 MG per tablet Take 1 tablet by mouth 4 (four) times daily.       03-14-2000 warfarin (COUMADIN) 5 MG tablet Take 2.5-5 mg by mouth See admin instructions. TAKE 1/2 TABLET (2.5mg  total) on M-W-F and takes one tablet (5mg  total) on all other days        Assessment: 80 yo M on chronic warfarin for mechanical valve + Afib.  Home dose  listed above.  INR is slightly above goal range on admission.  He is admitted with anorexia, nausea, & vomiting which likely explains elevation.  He is noted to have bloody stools.  Noted that GI recommends EGD when INR<1.5 and bridge with heparin infusion.  Would NOT recommend Vit K for reversal in patient with artifical valve unless procedure becomes urgent.  Discussed with cardiology- start heparin infusion now with target goal at higher end of normal range.  Goal of Therapy:  Heparin level 0.5-0.7 Monitor platelets per anticoagulation protocol INR 3-5-4.5 as documented on anticoagulation flowsheet/per MD order   Plan:  Continue to hold Coumadin  Daily INR Start heparin infusion at 800 units/hr.  No bolus.  Check ~8hr heparin level Daily Heparin level & CBC while on heparin  Kathalina Ostermann, [facility] 03/06/2014,1:17 PM

## 2014-03-06 NOTE — Care Management Utilization Note (Signed)
UR completed 

## 2014-03-06 NOTE — Progress Notes (Addendum)
Subjective: Wife at bedside. Large stool this morning. Poor appetite. In deep sleep but awakens to verbal stimuli.   Objective: Vital signs in last 24 hours: Temp:  [97.5 F (36.4 C)-99.4 F (37.4 C)] 97.5 F (36.4 C) (04/06 0656) Pulse Rate:  [68-93] 78 (04/06 0656) Resp:  [20-22] 22 (04/06 0656) BP: (76-121)/(45-69) 121/69 mmHg (04/06 0656) SpO2:  [9 %-100 %] 100 % (04/06 0656) Last BM Date: 03/05/14 General:   Sleeping, opens eyes to verbal stimuli.  Heart:  S1, S2 present, notable click consistent with artificial valve Lungs: Clear to auscultation bilaterally Abdomen:  Bowel sounds present, soft, non-tender, non-distended. No HSM or hernias noted.  Extremities:  Without lower extremity edema.   Intake/Output from previous day: 04/05 0701 - 04/06 0700 In: 50 [P.O.:50] Out: -  Intake/Output this shift:    Lab Results:  Recent Labs  03/04/14 1945 03/05/14 0620 03/06/14 0537  WBC 13.0* 11.2* 11.4*  HGB 13.1 12.4* 11.8*  HCT 39.8 37.3* 34.3*  PLT 206 187 177   BMET  Recent Labs  03/04/14 1945 03/05/14 0620 03/06/14 0537  NA 136* 138 137  K 3.6* 3.8 3.5*  CL 95* 100 100  CO2 25 27 24   GLUCOSE 197* 132* 141*  BUN 19 22 21   CREATININE 1.90* 2.04* 1.33  CALCIUM 10.1 9.5 9.1   LFT  Recent Labs  03/05/14 0620  PROT 6.2  ALBUMIN 3.3*  AST 28  ALT 14  ALKPHOS 52  BILITOT 1.5*   PT/INR  Recent Labs  03/05/14 0953 03/06/14 0537  LABPROT 41.1* 35.5*  INR 4.52* 3.73*     Studies/Results: Ct Abdomen Pelvis Wo Contrast  03/05/2014   CLINICAL DATA:  Diffuse abdominal pain with nausea, vomiting, and diarrhea. Rectal bleeding. Severe back pain.  EXAM: CT ABDOMEN AND PELVIS WITHOUT CONTRAST  TECHNIQUE: Multidetector CT imaging of the abdomen and pelvis was performed following the standard protocol without intravenous contrast.  COMPARISON:  CT scan dated 07/02/2010  FINDINGS: There is mucosal thickening of the descending and sigmoid portions of the  colon with slight pericolonic soft tissue stranding in the sigmoid region. There are multiple diverticula in the sigmoid colon but the appearance is most consistent with colitis and not diverticulitis. There is small amount of fluid in the pericolic gutter adjacent to the area of inflammation of the colon.  The rest of the bowel is normal.  Liver parenchyma, spleen, pancreas, and adrenal glands are normal. There is air in the bile ducts from previous sphincterotomy. Bladder appears normal. No acute osseous abnormalities.  There are extensive vascular calcifications in both kidneys. No hydronephrosis. There are stable bilateral renal cysts. There is extensive calcification in the abdominal aorta and iliac arteries as well as in the origin of the inferior mesenteric artery as well as in the celiac and superior mesenteric arteries and splenic artery.  There small bilateral inguinal hernias containing only fat. Sacroiliac joints are fused.  IMPRESSION: Colitis involving the descending and sigmoid portions of the colon. The possibility of ischemic colitis should be considered.   Electronically Signed   By: 05/05/2014 M.D.   On: 03/05/2014 13:40    Assessment: 80 year old male admitted with N/V, abdominal pain, anorexia, bloody diarrhea, CT with colitis of descending and sigmoid colon. Anticoagulated secondary to mechanical heart valves. Ultimately needs colonoscopy/EGD with INR less than 1.5. Cardiology has been consulted for assistance and recommendations regarding transition to heparin drip and potential dilation/polypectomy if indicated.   Calcification noted in IMA,  celiac and SMA. Discussed with Dr. Tyron Russell. Significant atherosclerotic disease but difficult to note the extent secondary to non-contrast study. Concern for chronic mesenteric ischemia. CT angiogram would be beneficial in sorting out further.   GI pathogen panel pending. No significant change from a GI standpoint.    Plan: Protonix  BID Await cardiology recommendations: needs colonoscopy/EGD but INR will need to be less than 1.5. Needs transition to Heparin drip Follow-up on GI pathogen panel CT angiogram Supportive measures.  Nira Retort, ANP-BC [facility]     LOS: 2 days    03/06/2014, 7:59 AM  Attending note  Patient seen and examined. Gastric emptying study reviewed. Emptying is normal. I feel we can reasonably conclude from this nuclear medicine study the patient is less likely to have a mechanical gastric outlet obstruction or significant, if any, gastroparesis. He could, however, still be harboring significant pathology in his upper GI tract. From a GI standpoint, I do not feel it is necessary to alter his anticoagulation regimen at this point. I feel it should be continued with a target INR of 3.5-4.5 as suggested by cardiology. Currently, its out of range for a diagnostic EGD. However, I would offer this gentleman a diagnostic EGD once his INR gets back into the target range. This can be done without any significant increase in risk in bleeding, etc..  Will advance diet this evening. We'll make him n.p.o. past midnight tonight. We'll reassess patient in the a.m.  As far as his colon is concerned, at this point, I do not advocate pursuing a colonoscopy. A conservative approach may be best. He likely suffered a bout of ischemic colitis which is almost always self limiting and not recurrent. An interval followup CT scan in 6-8 weeks could help sort out if needed. Moreover, I doubt a colonoscopy at any point would change management very much.  I discussed with Dr. Ouida Sills via telephone this afternoon. No family present when I came to see the patient this afternoon.  I've also discussed with Scott in the pharmacy. We'll hold off on initiating heparin at this time.

## 2014-03-06 NOTE — Consult Note (Signed)
Primary cardiologist: Dr Prentice Docker Consulting cardiologist: Dr. Dina Rich    Clinical Summary Mr. Tumbleson is a 80 y.o.male hx of mechanical St Jude AVR and Starr edwards MVR on coumadin with goal INR 3.5-4.5 per notes due to CVA with INR at 3, afib, HTN, rheumatoid arthritis, PAD, admitted 03/04/14 with nausea, vomiting, and diarrhea. After 3rd episode noticed some bright red blood in commode. CT scan showed colitis of the descending and sigmoid colon, potentially ischemic colitis. Cardiology is consulted for assistance with anticoag managment  K 5.4, Cr 0.98, BUN 16, Hgb 13.6, Plt 227, pro-BNP 2670, INR 4.97 on admission. Last INR 3.73 this AM.   Allergies  Allergen Reactions  . Codeine Nausea And Vomiting  . Penicillins Nausea And Vomiting    Medications Scheduled Medications: . carvedilol  3.125 mg Oral BID WC  . feeding supplement (ENSURE COMPLETE)  237 mL Oral TID BM  . levothyroxine  125 mcg Oral QAC breakfast  . ondansetron (ZOFRAN) IV  4 mg Intravenous TID WC & HS  . pantoprazole  40 mg Oral BID AC  . potassium chloride  20 mEq Oral TID  . predniSONE  5 mg Oral q morning - 10a     Infusions: . sodium chloride 50 mL/hr at 03/04/14 2357     PRN Medications:  acetaminophen, acetaminophen, morphine injection, ondansetron (ZOFRAN) IV, ondansetron   Past Medical History  Diagnosis Date  . LV dysfunction      ejection fraction 45-50%  . Rheumatic heart disease      status post St. Jude aortic valve Starr-Edwards mitral valve replacement  . Permanent atrial fibrillation   . Cerebral embolism     Secondary to subtherapeutic INR  . Abdominal aortic aneurysm     small aneurysm  . Hypertension   . Rheumatoid arthritis(714.0)     Severe requiring steroid therapy  . Gastric erosions      resolved  . Cholangitis     Complicated by Escherichia coli bacteremia without endocarditis status post ERCP and sphincterotomy.  . Chronic anticoagulation    INR 3.5-4.5  . Stroke     History reviewed. No pertinent past surgical history.  History reviewed. No pertinent family history.  Social History [name] reports that he quit smoking about 40 years ago. His smoking use included Cigarettes. He has a 25 pack-year smoking history. He has never used smokeless tobacco. Mr. Grundman has no alcohol history on file.  Review of Systems CONSTITUTIONAL: No weight loss, fever, chills, weakness or fatigue.  HEENT: Eyes: No visual loss, blurred vision, double vision or yellow sclerae. No hearing loss, sneezing, congestion, runny nose or sore throat.  SKIN: No rash or itching.  CARDIOVASCULAR: No chest pain, chest pressure or chest discomfort. No palpitations or edema.  RESPIRATORY: No shortness of breath, cough or sputum.  GASTROINTESTINAL: N/V/D GENITOURINARY: no polyuria, no dysuria NEUROLOGICAL: No headache, dizziness, syncope, paralysis, ataxia, numbness or tingling in the extremities. No change in bowel or bladder control.  MUSCULOSKELETAL: No muscle, back pain, joint pain or stiffness.  HEMATOLOGIC: No anemia, bleeding or bruising.  LYMPHATICS: No enlarged nodes. No history of splenectomy.  PSYCHIATRIC: No history of depression or anxiety.      Physical Examination Blood pressure 121/69, pulse 78, temperature 100.8 F (38.2 C), temperature source Oral, resp. rate 22, height 5\' 5"  (1.651 m), weight 150 lb 9.2 oz (68.3 kg), SpO2 100.00%.  Intake/Output Summary (Last 24 hours) at 03/06/14 1219 Last data filed at 03/06/14 0900  Gross per 24 hour  Intake    290 ml  Output    300 ml  Net    -10 ml    Cardiovascular: irregular, mechanical S1 and S2  Respiratory: CTAB  GI: abdomen soft, NT, ND  MSK: LEs are warm, no edema  Neuro: no focal deficits    Lab Results  Basic Metabolic Panel:  Recent Labs Lab 03/03/14 1533 03/04/14 1945 03/05/14 0620 03/06/14 0537  NA 135* 136* 138 137  K 5.4* 3.6* 3.8 3.5*  CL 95* 95* 100  100  CO2 26 25 27 24   GLUCOSE 186* 197* 132* 141*  BUN 16 19 22 21   CREATININE 0.98 1.90* 2.04* 1.33  CALCIUM 10.5 10.1 9.5 9.1    Liver Function Tests:  Recent Labs Lab 03/05/14 0620  AST 28  ALT 14  ALKPHOS 52  BILITOT 1.5*  PROT 6.2  ALBUMIN 3.3*    CBC:  Recent Labs Lab 03/03/14 1533 03/04/14 1945 03/05/14 0620 03/06/14 0537  WBC 9.9 13.0* 11.2* 11.4*  NEUTROABS  --  11.2*  --   --   HGB 13.6 13.1 12.4* 11.8*  HCT 40.9 39.8 37.3* 34.3*  MCV 99.3 99.0 98.9 98.0  PLT 227 206 187 177    Cardiac Enzymes: No results found for this basename: CKTOTAL, CKMB, CKMBINDEX, TROPONINI,  in the last 168 hours  BNP: No components found with this basename: POCBNP,    Impression/Recommendations 1. GI bleed - trending down Hgb, other cell lines have trended down. In setting of diarrhea with IVF he likely was hemocenctrated at admission, unclear if the Hgb decrease is purely from bleeding. - further workup per GI, per notes INR needs to be less than 1.5 in order to perform EGD and colonscopy.   2. Valvular heart disease - St jude mechanical valve in AV position, Starr-Edwards mechanical valve in MV position. - very high risk for embolic CVA due to prior history of CVA, dual mechanical valves inclusing Starr-Edwards which has higher risk, along with afib - per notes he had a CVA when INR was 3, his goal INR has been 3.5-4.5. Currently INR is 3.7, coumadin is on hold.  - given he is nearing the bottom of his therapeutic range, will initiaite heparin gtt today. Would not stop heparin at any point unless absolutely neccesary and discussed with cardiology.   3. Afib - has had some soft bp's at times, he is off his coreg currently - rates have been normal thus far, continue to follow - anticoag management as described above.     05/05/14, M.D., F.A.C.C.

## 2014-03-06 NOTE — Progress Notes (Signed)
ANTICOAGULATION CONSULT NOTE  Pharmacy Consult for Coumadin Indication: AVR/MVR + Afib  Allergies  Allergen Reactions  . Codeine Nausea And Vomiting  . Penicillins Nausea And Vomiting    Patient Measurements: Height: 5\' 5"  (165.1 cm) Weight: 150 lb 9.2 oz (68.3 kg) IBW/kg (Calculated) : 61.5  Vital Signs: Temp: 98.3 F (36.8 C) (04/06 1357) Temp src: Oral (04/06 1357) BP: 107/61 mmHg (04/06 1357) Pulse Rate: 90 (04/06 1357)  Labs:  Recent Labs  03/04/14 1945 03/05/14 0620 03/05/14 0953 03/06/14 0537  HGB 13.1 12.4*  --  11.8*  HCT 39.8 37.3*  --  34.3*  PLT 206 187  --  177  LABPROT 44.2*  --  41.1* 35.5*  INR 4.97*  --  4.52* 3.73*  CREATININE 1.90* 2.04*  --  1.33    Estimated Creatinine Clearance: 38.5 ml/min (by C-G formula based on Cr of 1.33).   Medical History: Past Medical History  Diagnosis Date  . LV dysfunction      ejection fraction 45-50%  . Rheumatic heart disease      status post St. Jude aortic valve Starr-Edwards mitral valve replacement  . Permanent atrial fibrillation   . Cerebral embolism     Secondary to subtherapeutic INR  . Abdominal aortic aneurysm     small aneurysm  . Hypertension   . Rheumatoid arthritis(714.0)     Severe requiring steroid therapy  . Gastric erosions      resolved  . Cholangitis     Complicated by Escherichia coli bacteremia without endocarditis status post ERCP and sphincterotomy.  . Chronic anticoagulation      INR 3.5-4.5  . Stroke     Medications:  Prescriptions prior to admission  Medication Sig Dispense Refill  . carvedilol (COREG) 3.125 MG tablet Take 3.125 mg by mouth 2 (two) times daily with a meal.      . furosemide (LASIX) 20 MG tablet Take 20-40 mg by mouth 2 (two) times daily. Takes two tablets in the morning and one tablet in the evening      . HYDROcodone-acetaminophen (NORCO/VICODIN) 5-325 MG per tablet Take 0.5-1 tablets by mouth every 6 (six) hours as needed for moderate pain.      06-10-1981  levothyroxine (SYNTHROID, LEVOTHROID) 125 MCG tablet Take 1 tablet by mouth every morning.       . methocarbamol (ROBAXIN) 500 MG tablet Take 250-500 mg by mouth 2 (two) times daily as needed for muscle spasms.      . Multiple Vitamin (MULTIVITAMIN WITH MINERALS) TABS tablet Take 1 tablet by mouth daily.      . potassium chloride (K-DUR) 10 MEQ tablet Take 1 tablet (10 mEq total) by mouth every evening.  30 tablet  0  . predniSONE (DELTASONE) 5 MG tablet Take 1 tablet by mouth every morning.       . ramipril (ALTACE) 5 MG capsule Take 1 capsule by mouth every morning.       . terazosin (HYTRIN) 2 MG capsule Take 2 mg by mouth every evening.       . traMADol-acetaminophen (ULTRACET) 37.5-325 MG per tablet Take 1 tablet by mouth 4 (four) times daily.       03-14-2000 warfarin (COUMADIN) 5 MG tablet Take 2.5-5 mg by mouth See admin instructions. TAKE 1/2 TABLET (2.5mg  total) on M-W-F and takes one tablet (5mg  total) on all other days        Assessment: 80 yo M on chronic warfarin for mechanical valve + Afib.  Home dose  listed above.  INR is slightly above goal range on admission.  He is admitted with anorexia, nausea, & vomiting which likely explains elevation.  He is noted to have bloody stools.  Noted that GI recommends EGD when INR<1.5 and bridge with heparin infusion.  Would NOT recommend Vit K for reversal in patient with artifical valve unless procedure becomes urgent.  Discussed with cardiology- start heparin infusion now with target goal at higher end of normal range. Discussed with Dr. Ouida Sills, discontinue heparin, restart Coumadin tonight. Possible discharge in AM  Goal of Therapy:  Heparin level 0.5-0.7 Monitor platelets per anticoagulation protocol INR 3-5-4.5 as documented on anticoagulation flowsheet/per MD order   Plan:  Discontinue Heparin Coumadin 4 mg po tonight Daily INR   Raquel James, Piotr Christopher Bennett 03/06/2014,5:26 PM

## 2014-03-07 LAB — PROTIME-INR
INR: 3.44 — ABNORMAL HIGH (ref 0.00–1.49)
PROTHROMBIN TIME: 33.4 s — AB (ref 11.6–15.2)

## 2014-03-07 LAB — BASIC METABOLIC PANEL
BUN: 11 mg/dL (ref 6–23)
CALCIUM: 9.9 mg/dL (ref 8.4–10.5)
CO2: 24 mEq/L (ref 19–32)
Chloride: 105 mEq/L (ref 96–112)
Creatinine, Ser: 0.97 mg/dL (ref 0.50–1.35)
GFR, EST AFRICAN AMERICAN: 88 mL/min — AB (ref >90)
GFR, EST NON AFRICAN AMERICAN: 76 mL/min — AB (ref >90)
Glucose, Bld: 126 mg/dL — ABNORMAL HIGH (ref 70–99)
Potassium: 4.8 mEq/L (ref 3.7–5.3)
Sodium: 140 mEq/L (ref 137–147)

## 2014-03-07 LAB — CBC
HCT: 35.2 % — ABNORMAL LOW (ref 39.0–52.0)
Hemoglobin: 11.5 g/dL — ABNORMAL LOW (ref 13.0–17.0)
MCH: 32.5 pg (ref 26.0–34.0)
MCHC: 32.7 g/dL (ref 30.0–36.0)
MCV: 99.4 fL (ref 78.0–100.0)
Platelets: 187 10*3/uL (ref 150–400)
RBC: 3.54 MIL/uL — ABNORMAL LOW (ref 4.22–5.81)
RDW: 14.5 % (ref 11.5–15.5)
WBC: 8.1 10*3/uL (ref 4.0–10.5)

## 2014-03-07 LAB — H. PYLORI ANTIBODY, IGG: H Pylori IgG: 0.6 {ISR}

## 2014-03-07 MED ORDER — WARFARIN - PHARMACIST DOSING INPATIENT
Status: DC
  Administered 2014-03-07: 16:00:00

## 2014-03-07 MED ORDER — TRAMADOL-ACETAMINOPHEN 37.5-325 MG PO TABS
1.0000 | ORAL_TABLET | Freq: Four times a day (QID) | ORAL | Status: DC | PRN
  Administered 2014-03-07 – 2014-03-10 (×5): 1 via ORAL
  Filled 2014-03-07 (×5): qty 1

## 2014-03-07 MED ORDER — WARFARIN SODIUM 2 MG PO TABS
4.0000 mg | ORAL_TABLET | Freq: Once | ORAL | Status: AC
  Administered 2014-03-07: 4 mg via ORAL
  Filled 2014-03-07: qty 2

## 2014-03-07 NOTE — Progress Notes (Signed)
Wife concerned about taking patient home. States pt is still nauseated and unable to take PO well. Dr. Ouida Sills called and made aware of concerns. Case manager present at this time. New orders received to discontinue discharge order and contact pharmacy regarding coumadin management. Wife updated and agrees to aforementioned. Gerrit Halls, NP also made aware via telephone that patient was not being discharged today. Stated would see patient tomorrow on 4/8.

## 2014-03-07 NOTE — Progress Notes (Addendum)
REVIEWED. FAMILY(BRON, SISTER-IN-LAW, WIFE) CONCERNED ABOUT PT'S DECREASED APPETITE AND NAUSEA. WOULD LIKE ADDITIONAL WORKUP TO INCLUDE GES AND EGD WHEN INR < 2.0. WILL ONLY PERFORM TCS WITH MIRALAX PREP IF CAUSE FOR NAUSEA/ANOREXIA NOT REVEALED BY PRIOR STUDIES. FAMILY UNDERSTANDS THAT WE WILL ONLY PERFORM BIOPSIES OR DILATION IF CLINICALLY NECESSARY AND IF EXPECTED TO IMPROVE HIS CONDITION WHILE MINIMIZING HIS RISK FOR VALVE FAILURE/DYSFUNCTION OR A THROMBOEMBOLIC EVENT. WILL WORK WITH CARDIOLOGY/PHARMACY TO MINIMIZE THE TIME HE IS OFF OFF HEPARIN GTT.

## 2014-03-07 NOTE — Progress Notes (Signed)
ANTICOAGULATION CONSULT NOTE  Pharmacy Consult for Coumadin Indication: AVR/MVR + Afib  Allergies  Allergen Reactions  . Codeine Nausea And Vomiting  . Penicillins Nausea And Vomiting   Patient Measurements: Height: 5\' 5"  (165.1 cm) Weight: 150 lb 9.2 oz (68.3 kg) IBW/kg (Calculated) : 61.5  Vital Signs: Temp: 98.1 F (36.7 C) (04/07 0500) Temp src: Oral (04/07 0500) BP: 125/7 mmHg (04/07 0500) Pulse Rate: 82 (04/07 0500)  Labs:  Recent Labs  03/05/14 0620 03/05/14 0953 03/06/14 0537 03/07/14 0528  HGB 12.4*  --  11.8* 11.5*  HCT 37.3*  --  34.3* 35.2*  PLT 187  --  177 187  LABPROT  --  41.1* 35.5* 33.4*  INR  --  4.52* 3.73* 3.44*  CREATININE 2.04*  --  1.33 0.97    Estimated Creatinine Clearance: 52.8 ml/min (by C-G formula based on Cr of 0.97).  Medical History: Past Medical History  Diagnosis Date  . LV dysfunction      ejection fraction 45-50%  . Rheumatic heart disease      status post St. Jude aortic valve Starr-Edwards mitral valve replacement  . Permanent atrial fibrillation   . Cerebral embolism     Secondary to subtherapeutic INR  . Abdominal aortic aneurysm     small aneurysm  . Hypertension   . Rheumatoid arthritis(714.0)     Severe requiring steroid therapy  . Gastric erosions      resolved  . Cholangitis     Complicated by Escherichia coli bacteremia without endocarditis status post ERCP and sphincterotomy.  . Chronic anticoagulation      INR 3.5-4.5  . Stroke    Medications:  Prescriptions prior to admission  Medication Sig Dispense Refill  . carvedilol (COREG) 3.125 MG tablet Take 3.125 mg by mouth 2 (two) times daily with a meal.      . furosemide (LASIX) 20 MG tablet Take 20-40 mg by mouth 2 (two) times daily. Takes two tablets in the morning and one tablet in the evening      . HYDROcodone-acetaminophen (NORCO/VICODIN) 5-325 MG per tablet Take 0.5-1 tablets by mouth every 6 (six) hours as needed for moderate pain.      06-10-1981  levothyroxine (SYNTHROID, LEVOTHROID) 125 MCG tablet Take 1 tablet by mouth every morning.       . methocarbamol (ROBAXIN) 500 MG tablet Take 250-500 mg by mouth 2 (two) times daily as needed for muscle spasms.      . Multiple Vitamin (MULTIVITAMIN WITH MINERALS) TABS tablet Take 1 tablet by mouth daily.      . potassium chloride (K-DUR) 10 MEQ tablet Take 1 tablet (10 mEq total) by mouth every evening.  30 tablet  0  . predniSONE (DELTASONE) 5 MG tablet Take 1 tablet by mouth every morning.       . ramipril (ALTACE) 5 MG capsule Take 1 capsule by mouth every morning.       . terazosin (HYTRIN) 2 MG capsule Take 2 mg by mouth every evening.       . traMADol-acetaminophen (ULTRACET) 37.5-325 MG per tablet Take 1 tablet by mouth 4 (four) times daily.       03-14-2000 warfarin (COUMADIN) 5 MG tablet Take 2.5-5 mg by mouth See admin instructions. TAKE 1/2 TABLET (2.5mg  total) on M-W-F and takes one tablet (5mg  total) on all other days       Assessment: 80 yo M on chronic warfarin for mechanical valve + Afib.  Home dose listed above.  INR  is slightly above goal range on admission.  He is admitted with anorexia, nausea, & vomiting which likely explains elevation.  Some n/v persists per report therefore will not be discharged.   On 4/6 case was discussed with Dr. Ouida Sills.  Received orders to discontinue heparin, restart Coumadin.  4/7:  Discharge held up due to n/v.  INR 3.44  Goal of Therapy:  Monitor platelets per anticoagulation protocol: Yes INR 3-5-4.5 as documented on anticoagulation flowsheet/per MD order   Plan:  Coumadin 4mg  po today x 1 Daily INR  A 03/07/2014,2:04 PM

## 2014-03-07 NOTE — Progress Notes (Signed)
Subjective: Up in chair. More alert today. Denies abdominal pain, N/V currently. No rectal bleeding today. 4 loose stools yesterday.  Objective: Vital signs in last 24 hours: Temp:  [98.1 F (36.7 C)-100.8 F (38.2 C)] 98.1 F (36.7 C) (04/07 0500) Pulse Rate:  [70-90] 82 (04/07 0500) Resp:  [20] 20 (04/07 0500) BP: (107-125)/(7-61) 125/7 mmHg (04/07 0500) SpO2:  [94 %-97 %] 94 % (04/07 0500) Last BM Date: 03/06/14 General:   Alert and oriented to person only.  Heart:  S1, S2 present, irregular, with notable click Lungs: Clear to auscultation bilaterally Abdomen:  Bowel sounds present, round but non-tender, soft. Query small umbilical hernia.  Extremities:  With arthritic changes Neurologic:  Alert and  oriented to person only  Intake/Output from previous day: 04/06 0701 - 04/07 0700 In: 480 [P.O.:480] Out: 600 [Urine:600] Intake/Output this shift:    Lab Results:  Recent Labs  03/05/14 0620 03/06/14 0537 03/07/14 0528  WBC 11.2* 11.4* 8.1  HGB 12.4* 11.8* 11.5*  HCT 37.3* 34.3* 35.2*  PLT 187 177 187   BMET  Recent Labs  03/05/14 0620 03/06/14 0537 03/07/14 0528  NA 138 137 140  K 3.8 3.5* 4.8  CL 100 100 105  CO2 27 24 24   GLUCOSE 132* 141* 126*  BUN 22 21 11   CREATININE 2.04* 1.33 0.97  CALCIUM 9.5 9.1 9.9   LFT  Recent Labs  03/05/14 0620  PROT 6.2  ALBUMIN 3.3*  AST 28  ALT 14  ALKPHOS 52  BILITOT 1.5*   PT/INR  Recent Labs  03/06/14 0537 03/07/14 0528  LABPROT 35.5* 33.4*  INR 3.73* 3.44*     Studies/Results: Ct Abdomen Pelvis Wo Contrast  03/05/2014   CLINICAL DATA:  Diffuse abdominal pain with nausea, vomiting, and diarrhea. Rectal bleeding. Severe back pain.  EXAM: CT ABDOMEN AND PELVIS WITHOUT CONTRAST  TECHNIQUE: Multidetector CT imaging of the abdomen and pelvis was performed following the standard protocol without intravenous contrast.  COMPARISON:  CT scan dated 07/02/2010  FINDINGS: There is mucosal thickening of the  descending and sigmoid portions of the colon with slight pericolonic soft tissue stranding in the sigmoid region. There are multiple diverticula in the sigmoid colon but the appearance is most consistent with colitis and not diverticulitis. There is small amount of fluid in the pericolic gutter adjacent to the area of inflammation of the colon.  The rest of the bowel is normal.  Liver parenchyma, spleen, pancreas, and adrenal glands are normal. There is air in the bile ducts from previous sphincterotomy. Bladder appears normal. No acute osseous abnormalities.  There are extensive vascular calcifications in both kidneys. No hydronephrosis. There are stable bilateral renal cysts. There is extensive calcification in the abdominal aorta and iliac arteries as well as in the origin of the inferior mesenteric artery as well as in the celiac and superior mesenteric arteries and splenic artery.  There small bilateral inguinal hernias containing only fat. Sacroiliac joints are fused.  IMPRESSION: Colitis involving the descending and sigmoid portions of the colon. The possibility of ischemic colitis should be considered.   Electronically Signed   By: 05/05/2014 M.D.   On: 03/05/2014 13:40    Assessment: 80 year old male admitted with N/V, abdominal pain, anorexia, bloody diarrhea, CT with colitis of descending and sigmoid colon. Anticoagulated secondary to mechanical heart valves. Cardiology input noted and appreciated. Family hesitant to proceed with any invasive procedures currently. GI pathogen panel negative. Clinically improved from admission but would benefit from  colonoscopy/EGD for further evaluation if patient and family desire.   Calcification noted in IMA, celiac and SMA on CT. CTA cancelled, as family does not want more invasive procedures. Could have underlying chronic mesenteric ischemia but no acute issues currently.   H.pylori serology: pending. Treat if positive.   Plan: Follow-up on pending  H.pylori serology and treat if positive Continue PPI BID Recommend colonoscopy/EGD for further evaluation; family declining currently Advance as tolerated to soft mechanical diet Appears patient will be discharged today: will follow peripherally   Nira Retort, ANP-BC [facility]    LOS: 3 days    03/07/2014, 7:52 AM

## 2014-03-07 NOTE — Progress Notes (Signed)
Primary cardiologist: Dr Prentice Docker  Subjective:    Has had some nausea and abdominal pain. Discharge home was delayed.  Objective:   Temp:  [98.1 F (36.7 C)-98.3 F (36.8 C)] 98.1 F (36.7 C) (04/07 0500) Pulse Rate:  [70-82] 82 (04/07 0500) Resp:  [20] 20 (04/07 0500) BP: (117-125)/(7-56) 125/7 mmHg (04/07 0500) SpO2:  [94 %-97 %] 94 % (04/07 0500) Last BM Date: 03/06/14  Filed Weights   03/04/14 2154  Weight: 150 lb 9.2 oz (68.3 kg)    Intake/Output Summary (Last 24 hours) at 03/07/14 1410 Last data filed at 03/07/14 1300  Gross per 24 hour  Intake    240 ml  Output    300 ml  Net    -60 ml    Exam:  General: In no distress.  Lungs: Clear, nonlabored.  Cardiac: Crisp prosthetic heart sounds.  Abdomen: NABS.  Extremities: No pitting.   Lab Results:  Basic Metabolic Panel:  Recent Labs Lab 03/05/14 0620 03/06/14 0537 03/07/14 0528  NA 138 137 140  K 3.8 3.5* 4.8  CL 100 100 105  CO2 27 24 24   GLUCOSE 132* 141* 126*  BUN 22 21 11   CREATININE 2.04* 1.33 0.97  CALCIUM 9.5 9.1 9.9    Liver Function Tests:  Recent Labs Lab 03/05/14 0620  AST 28  ALT 14  ALKPHOS 52  BILITOT 1.5*  PROT 6.2  ALBUMIN 3.3*    CBC:  Recent Labs Lab 03/05/14 0620 03/06/14 0537 03/07/14 0528  WBC 11.2* 11.4* 8.1  HGB 12.4* 11.8* 11.5*  HCT 37.3* 34.3* 35.2*  MCV 98.9 98.0 99.4  PLT 187 177 187    Coagulation:  Recent Labs Lab 03/05/14 0953 03/06/14 0537 03/07/14 0528  INR 4.52* 3.73* 3.44*     Medications:   Scheduled Medications: . carvedilol  3.125 mg Oral BID WC  . feeding supplement (ENSURE COMPLETE)  237 mL Oral TID BM  . levothyroxine  125 mcg Oral QAC breakfast  . ondansetron (ZOFRAN) IV  4 mg Intravenous TID WC & HS  . pantoprazole  40 mg Oral BID AC  . potassium chloride  20 mEq Oral TID  . predniSONE  5 mg Oral q morning - 10a     Infusions: . sodium chloride 20 mL/hr (03/07/14 1251)     PRN  Medications:  acetaminophen, acetaminophen, morphine injection, ondansetron (ZOFRAN) IV, ondansetron, traMADol-acetaminophen   Assessment:   1. History of St. Jude AVR and Starr-Edwards MVR, on Coumadin chronically with typical goal INR 3.5-4.5 in light of previous stroke and high thromboembolic risk. INR 3.4 today.  2. GI bleed and ischemic colitis. Coumadin was held initially pending further workup to include endoscopy. 05/07/14 tells me that decision has been to hold off on further testing however. Coumadin was resumed by pharmacy.   Plan/Discussion:    Spoke with patient and family members in room. There appeared to be significant confusion about the plan going forward as far as further workup and whether he would be undergoing endoscopy or not. I spoke with Dr. 05/07/14, and it looks like initial plan was not to pursue further endoscopy if patient improved symptomatically. From a cardiac risk perspective, he could undergo endoscopy if necessary and felt to be helpful in his management. Main issue would be helping to bridge with heparin while off Coumadin, given his high thromboembolic risk. Coumadin is being managed by pharmacy. Please note that the goal therapeutic INR range has been 3.5-4.5. Would consider heparin even  in the short-term while adjusting his Coumadin dose.   Jonelle Sidle, M.D., F.A.C.C.

## 2014-03-07 NOTE — Discharge Summary (Signed)
[name], [name]                ACCOUNT NO.:  [id number]  MEDICAL RECORD NO.:  [id number]  LOCATION:                                 FACILITY:  PHYSICIAN:  Kingsley Callander. Ouida Sills, MD       DATE OF BIRTH:  1932/06/10  DATE OF ADMISSION:  03/04/2014 DATE OF DISCHARGE:  LH                              DISCHARGE SUMMARY   DISCHARGE DIAGNOSES: 1. Ischemic colitis. 2. Acute renal failure. 3. Rheumatic heart disease, status post mitral and aortic valve     replacement. 4. Hypothyroidism. 5. Type 2 diabetes. 6. Hypertension. 7. Rheumatoid arthritis. 8. Atrial fibrillation.  HOSPITAL COURSE:  This patient is an 80 year old male who presented with nausea, vomiting, abdominal pain, and bright red rectal bleeding.  His initial white count was 13.0 with 86 neutrophils and 6 lymphocytes.  BUN and creatinine had increased to 19 and 1.90.  He was treated with IV fluids.  His bleeding stopped.  He is anticoagulated with Coumadin for mitral and aortic valve replacement.  He was seen in Gastroenterology consultation.  He underwent a CT scan of the abdomen which was consistent with ischemic colitis in the descending and sigmoid colon regions.  His white count normalized to 8.1.  Bleeding stopped.  His hemoglobin is 11.5.  Coumadin was held, but then restarted on the evening of the 6th.  His INR now is 3.44.  A GI pathogen panel was negative.  His renal insufficiency resolved with a drop in his BUN and creatinine to 11 and 0.97 with IV hydration.  Serum potassium dropped to 3.5 and improved to 4.8 with supplements.  He tolerated a full liquid diet.  He wanted to go home and was therefore discharged on the 7th.  His condition at discharge is much improved.  He will have followup in my office in 1 week.  He will resume his Coumadin dosing per Cardiology.  DISCHARGE MEDICATIONS: 1. Carvedilol 3.125 mg b.i.d. 2. Coumadin 2.5 mg on Monday, Wednesday, Friday and 5 mg on other     days. 3. Lasix 40 mg  in the morning, 20 mg in the evening. 4. Norco 5/325, 0.5-1 tablet q.6 p.r.n. 5. Levothyroxine 125 mcg daily. 6. Methocarbamol 500 mg tablet take 250-500 mg b.i.d. p.r.n. for     muscle spasms. 7. Multivitamin with minerals daily. 8. Potassium 10 mEq daily. 9. Prednisone 5 mg daily. 10.Ramipril 5 mg daily. 11.Terazosin 2 mg daily. 12.Tramadol/acetaminophen 37.5-325, 1 tablet 4 times daily.     Kingsley Callander. Ouida Sills, MD     ROF/MEDQ  D:  03/07/2014  T:  03/07/2014  Job:  [id number]

## 2014-03-08 ENCOUNTER — Encounter (HOSPITAL_COMMUNITY): Payer: Self-pay

## 2014-03-08 ENCOUNTER — Inpatient Hospital Stay (HOSPITAL_COMMUNITY): Payer: Medicare Other

## 2014-03-08 LAB — CBC
HCT: 36.6 % — ABNORMAL LOW (ref 39.0–52.0)
Hemoglobin: 11.9 g/dL — ABNORMAL LOW (ref 13.0–17.0)
MCH: 32.7 pg (ref 26.0–34.0)
MCHC: 32.5 g/dL (ref 30.0–36.0)
MCV: 100.5 fL — AB (ref 78.0–100.0)
PLATELETS: 213 10*3/uL (ref 150–400)
RBC: 3.64 MIL/uL — ABNORMAL LOW (ref 4.22–5.81)
RDW: 14.5 % (ref 11.5–15.5)
WBC: 8.3 10*3/uL (ref 4.0–10.5)

## 2014-03-08 LAB — PROTIME-INR
INR: 6.55 (ref 0.00–1.49)
Prothrombin Time: 54.6 seconds — ABNORMAL HIGH (ref 11.6–15.2)

## 2014-03-08 MED ORDER — TECHNETIUM TC 99M SULFUR COLLOID
2.0000 | Freq: Once | INTRAVENOUS | Status: AC | PRN
  Administered 2014-03-08: 2 via INTRAVENOUS

## 2014-03-08 NOTE — Progress Notes (Signed)
NAMEZAIDAN, KEEBLE                ACCOUNT NO.:  [id number]  MEDICAL RECORD NO.:  [id number]  LOCATION:                                 FACILITY:  PHYSICIAN:  Kingsley Callander. Ouida Sills, MD       DATE OF BIRTH:  [birth year]  DATE OF PROCEDURE:  03/07/2014 DATE OF DISCHARGE:                                PROGRESS NOTE   SUBJECTIVE:  Mr. Hardie Pulley was unable to be discharged because of persistent nausea.  He has not vomited.  He has not had bleeding.  He has not felt as though he could eat.  He has had no fever.  OBJECTIVE:  VITAL SIGNS:  Stable. LUNGS:  Clear. HEART:  Irregularly irregular with prosthetic valve sounds. ABDOMEN:  Soft and nontender. EXTREMITIES:  No edema.  IMPRESSION/PLAN: 1. Ischemic colitis.  We will discuss further with Gastroenterology.     I suspect his GI symptoms will improve as his ischemic colitis     improves.  Hemoglobin is 11.5, his white count is normalized to     8.1. 2. Status post valve replacement/atrial fibrillation.  INR is 3.4,     continue Coumadin management per pharmacy. 3. Diabetes, stable.  Fasting glucose is 126. 4. Rheumatoid arthritis.  He has experienced back pain, which was     evaluated in the emergency room last week with x-ray showing no     sign of fracture.  Treat with Ultracef as needed. 5. GI pathogen panel is negative.  Case was discussed with Cardiology.  We will not convert over to a heparin drip.  At this point, Coumadin will be continued with the hope that we will be able to avoid any additional procedures.     Kingsley Callander. Ouida Sills, MD     ROF/MEDQ  D:  03/08/2014  T:  03/08/2014  Job:  [id number]

## 2014-03-08 NOTE — Progress Notes (Signed)
ANTICOAGULATION CONSULT NOTE  Pharmacy Consult for Coumadin Indication: AVR/MVR + Afib  Allergies  Allergen Reactions  . Codeine Nausea And Vomiting  . Penicillins Nausea And Vomiting   Patient Measurements: Height: 5\' 5"  (165.1 cm) Weight: 150 lb 9.2 oz (68.3 kg) IBW/kg (Calculated) : 61.5  Vital Signs: Temp: 97.9 F (36.6 C) (04/07 2143) Temp src: Oral (04/07 2143) BP: 114/63 mmHg (04/08 0551) Pulse Rate: 91 (04/08 0551)  Labs:  Recent Labs  03/06/14 0537 03/07/14 0528 03/08/14 0443  HGB 11.8* 11.5* 11.9*  HCT 34.3* 35.2* 36.6*  PLT 177 187 213  LABPROT 35.5* 33.4* 54.6*  INR 3.73* 3.44* 6.55*  CREATININE 1.33 0.97  --    Estimated Creatinine Clearance: 52.8 ml/min (by C-G formula based on Cr of 0.97).  Medical History: Past Medical History  Diagnosis Date  . LV dysfunction      ejection fraction 45-50%  . Rheumatic heart disease      status post St. Jude aortic valve Starr-Edwards mitral valve replacement  . Permanent atrial fibrillation   . Cerebral embolism     Secondary to subtherapeutic INR  . Abdominal aortic aneurysm     small aneurysm  . Hypertension   . Rheumatoid arthritis(714.0)     Severe requiring steroid therapy  . Gastric erosions      resolved  . Cholangitis     Complicated by Escherichia coli bacteremia without endocarditis status post ERCP and sphincterotomy.  . Chronic anticoagulation      INR 3.5-4.5  . Stroke    Medications:  Prescriptions prior to admission  Medication Sig Dispense Refill  . carvedilol (COREG) 3.125 MG tablet Take 3.125 mg by mouth 2 (two) times daily with a meal.      . furosemide (LASIX) 20 MG tablet Take 20-40 mg by mouth 2 (two) times daily. Takes two tablets in the morning and one tablet in the evening      . HYDROcodone-acetaminophen (NORCO/VICODIN) 5-325 MG per tablet Take 0.5-1 tablets by mouth every 6 (six) hours as needed for moderate pain.      06-10-1981 levothyroxine (SYNTHROID, LEVOTHROID) 125 MCG tablet  Take 1 tablet by mouth every morning.       . methocarbamol (ROBAXIN) 500 MG tablet Take 250-500 mg by mouth 2 (two) times daily as needed for muscle spasms.      . Multiple Vitamin (MULTIVITAMIN WITH MINERALS) TABS tablet Take 1 tablet by mouth daily.      . potassium chloride (K-DUR) 10 MEQ tablet Take 1 tablet (10 mEq total) by mouth every evening.  30 tablet  0  . predniSONE (DELTASONE) 5 MG tablet Take 1 tablet by mouth every morning.       . ramipril (ALTACE) 5 MG capsule Take 1 capsule by mouth every morning.       . terazosin (HYTRIN) 2 MG capsule Take 2 mg by mouth every evening.       . traMADol-acetaminophen (ULTRACET) 37.5-325 MG per tablet Take 1 tablet by mouth 4 (four) times daily.       03-14-2000 warfarin (COUMADIN) 5 MG tablet Take 2.5-5 mg by mouth See admin instructions. TAKE 1/2 TABLET (2.5mg  total) on M-W-F and takes one tablet (5mg  total) on all other days       Assessment: 80 yo M on chronic warfarin for mechanical valve + Afib.  Home dose listed above.  INR is slightly above goal range on admission.  He is admitted with anorexia, nausea, & vomiting which likely explains  elevation.  Some n/v persists per report therefore will not be discharged.   On 4/6 case was discussed with Dr. Ouida Sills.  Received orders to discontinue heparin, restart Coumadin.  4/7:  Discharge held up due to n/v.  INR 3.44 4/8:  INR has risen to > 6.  HOLD Coumadin today  Goal of Therapy:  Monitor platelets per anticoagulation protocol: Yes Goal INR of 3-5-4.5 as documented on anticoagulation flowsheet/per MD order   Plan:  HOLD Coumadin today Daily INR  Phyllistine Domingos A Zania Kalisz 03/08/2014,8:00 AM

## 2014-03-08 NOTE — Progress Notes (Signed)
Dr. Juanetta Gosling notified about patients critical INR, states he will talk to Dr. Ouida Sills about it. Bennett Scrape RN

## 2014-03-08 NOTE — Evaluation (Signed)
Physical Therapy Evaluation Patient Details Name: [name] MRN: [id number] DOB: [birth year] Today's Date: 03/08/2014   History of Present Illness  past medical history of diastolic heart failure, mechanical aortic and mitral valve on chronic anticoagulation and rheumatoid arthritis who for the last several days has been having nausea with eating and then today started having a number of runs of diarrhea and a few episodes of nausea and vomiting. Following the third episode of diarrhea, patient noted some bright red blood in his stool and came into the emergency room  Clinical Impression  Pt hx of RA.  Pt states he has quit walking due to the pain.  Therapist urged pt to continue to exercise everyday to prevent pt from becoming bed ridden.     Follow Up Recommendations Home health PT    Equipment Recommendations  None recommended by PT    Recommendations for Other Services   none    Precautions / Restrictions Precautions Precautions: None Restrictions Weight Bearing Restrictions: No      Mobility  Bed Mobility Overal bed mobility: Modified Independent       Transfers Overall transfer level: Modified independent Equipment used: Rolling walker (2 wheeled)               Pertinent Vitals/Pain Pt has chronic pain but his back is bothering him more than anything.  Exercises completed to address pain.    Home Living Family/patient expects to be discharged to:: Private residence Living Arrangements: Spouse/significant other Available Help at Discharge: Family Type of Home: House Home Access: Stairs to enter   Secretary/administrator of Steps: 2 Home Layout: One level Home Equipment: Environmental consultant - 2 wheels      rior Function Level of Independence: Needs assistance   Gait / Transfers Assistance Needed: Pt states he has been very limited in his walking for the past month due to RA; pt uses a rolling walker  ADL's / Homemaking Assistance Needed: wife completes         Hand Dominance   Dominant Hand: Right    Extremity/Trunk Assessment               Lower Extremity Assessment: Generalized weakness         Communication   Communication: No difficulties  Cognition Arousal/Alertness: Awake/alert Behavior During Therapy: WFL for tasks assessed/performed Overall Cognitive Status: Within Functional Limits for tasks assessed                Exercises General Exercises - Lower Extremity Ankle Circles/Pumps: Both;5 reps Quad Sets: Both;10 reps Gluteal Sets:  (knee to chest and Passive hamstring stretch for pt back pain.) Heel Slides: Both;10 reps Hip ABduction/ADduction: Both;10 reps      Assessment/Plan    PT Assessment Patient needs continued PT services  PT Diagnosis Difficulty walking;Generalized weakness   PT Problem List Decreased strength;Decreased activity tolerance;Pain  PT Treatment Interventions Gait training;Functional mobility training;Therapeutic exercise   PT Goals (Current goals can be found in the Care Plan section) Acute Rehab PT Goals Patient Stated Goal: unsure; urged pt the importance of ambulating and completing exercises every day with dx of RA pt state he knows this and use to do this but just stopped. PT Goal Formulation: With patient Time For Goal Achievement: 03/10/14 Potential to Achieve Goals: Fair    Frequency Min 3X/week    End of Session Equipment Utilized During Treatment: Gait belt Activity Tolerance: Patient limited by fatigue;Patient limited by pain Patient left: in bed;with call bell/phone within reach;with  bed alarm set           Time: 0915-0950 PT Time Calculation (min): 35 min   Charges:   PT Evaluation $Initial PT Evaluation Tier I: 1 Procedure          [name] 03/08/2014, 10:00 AM

## 2014-03-08 NOTE — Progress Notes (Signed)
NAMENICKALOUS, STINGLEY                ACCOUNT NO.:  [id number]  MEDICAL RECORD NO.:  [id number]  LOCATION:                                 FACILITY:  PHYSICIAN:  Kingsley Callander. Ouida Sills, MD       DATE OF BIRTH:  [birth year]  DATE OF PROCEDURE:  03/08/2014 DATE OF DISCHARGE:                                PROGRESS NOTE   SUBJECTIVE:  Mr. [name] feels better this morning.  He denies nausea at present.  He denies abdominal pain.  He has continued to experience diminished appetite.  He had 4 recorded stools per yesterday.  He is not experiencing any bleeding.  His discharge was canceled yesterday because of persistent nausea.  His case was discussed further with Gastroenterology and gastric emptying study is planned for today.  His case was discussed further with Cardiology yesterday also.  OBJECTIVE:  VITAL SIGNS:  Temperature 97.9, pulse 91, respirations 20, blood pressure 114/63. LUNGS:  Clear. HEART:  Irregular with prosthetic valve sounds. ABDOMEN:  Soft and nontender. EXTREMITIES:  No edema.  He is also complaining of back pain.  His recent x-rays showed no fracture at the site of his discomfort. He was treated with Ultracef yesterday.  IMPRESSION/PLAN: 1. Ischemic colitis.  He overall appears to be improving.  His blood     counts are stable.  His hemoglobin is 11.9 today.  His appetite has     not yet returned.  His nausea is better this morning. 2. Nausea and difficulty eating.  We will proceed with gastric     emptying study as advised by Gastroenterology. 3. Status post mitral valve replacement, aortic valve replacement with     atrial fibrillation.  INR is increased to 6.55.  Coumadin will be     held today. 4. Hypokalemia, resolved. 5. Renal insufficiency, resolved. 6. Diabetes, stable.  We will try to improve his mobility today.  We will consult Physical Therapy.  It does not appear as though his wife is incapable of taking care of him at home in his current state.  He may  require a skilled nursing following hospitalization.     Kingsley Callander. Ouida Sills, MD     ROF/MEDQ  D:  03/08/2014  T:  03/08/2014  Job:  [id number]

## 2014-03-08 NOTE — Progress Notes (Addendum)
Subjective: Alert and conversant. Oriented to person, time, and situation. Denies diarrhea, rectal bleeding, nausea or vomiting currently. States he ate "fairly well" at dinner. Ate 75% of dinner last night per documentation.   Objective: Vital signs in last 24 hours: Temp:  [97.9 F (36.6 C)] 97.9 F (36.6 C) (04/07 2143) Pulse Rate:  [85-97] 91 (04/08 0551) Resp:  [16-20] 20 (04/08 0551) BP: (114-133)/(63-69) 114/63 mmHg (04/08 0551) SpO2:  [95 %-99 %] 99 % (04/08 0551) Last BM Date: 03/06/14 General:   Alert and oriented, pleasant Heart:  Notable click secondary to prosthetic heart vavles Abdomen:  Bowel sounds present, soft, non-tender, non-distended. No HSM. Small umbilical hernia noted.  Extremities:  Chronic arthritic changes, deformities of hands Neurologic:  Alert and  oriented x4 today Psych:  Alert and cooperative. Normal mood and affect.  Intake/Output from previous day: 04/07 0701 - 04/08 0700 In: 980 [P.O.:600; I.V.:380] Out: 150 [Urine:150] Intake/Output this shift:    Lab Results:  Recent Labs  03/06/14 0537 03/07/14 0528 03/08/14 0443  WBC 11.4* 8.1 8.3  HGB 11.8* 11.5* 11.9*  HCT 34.3* 35.2* 36.6*  PLT 177 187 213   BMET  Recent Labs  03/06/14 0537 03/07/14 0528  NA 137 140  K 3.5* 4.8  CL 100 105  CO2 24 24  GLUCOSE 141* 126*  BUN 21 11  CREATININE 1.33 0.97  CALCIUM 9.1 9.9    PT/INR  Recent Labs  03/07/14 0528 03/08/14 0443  LABPROT 33.4* 54.6*  INR 3.44* 6.55*   GI PATHOGEN PANEL NEGATIVE H.pylori serology negative  Assessment: 80 year old male admitted with N/V, abdominal pain, anorexia, bloody diarrhea, CT with colitis of descending and sigmoid colon, query ischemic colitis. GI pathogen panel negative. Anticoagulated secondary to mechanical heart valves. Cardiology input noted and appreciated. Family originally hesitant to proceed with any invasive procedures, but now desires GES and EGD. EGD pursued when INR less than  2. Will need to utilize Heparin drip. Would need coordination with cardiology and pharmacy to ensure timing of starting and holding Heparin prior to procedure.  TCS considered if nausea/anorexia etiology unclear after GES and EGD. Supratherapeutic INR: Coumadin on hold today.   Calcification noted in IMA, celiac and SMA on CT and reviewed with Dr. Tyron Russell. Consider CTA if clinically indicated.   H.pylori serology:negative.   Plan: GES today PPI BID EGD: needs heparin drip and INR less than 2 per GI attending. Would need to coordinate with Pharmacy. Coumadin on hold today. Will ask pharmacy to assist in dosing. Anticipate 4-5 hours off Heparin prior to procedure, then resuming if Cardiology agrees.      Nira Retort, ANP-BC [facility]     LOS: 4 days    03/08/2014, 7:53 AM    Addendum: Discussed with Scott in Pharmacy, who is willing to coordinate heparin drip if needed. Spoke with Dr. Ouida Sills as well. Appears wife is hesitant to pursue significant invasive procedures. Per Dr. Ouida Sills, review GES and reevaluate from that point.       This note written and placed in epic yesterday but showed up under 4/6 note tab.  I copied it and placed it in the appropriate position to co-sign Gerrit Halls note where it belongs from yesterday.   Nira Retort, ANP-BC [facility]  Attending note  Patient seen and examined. Gastric emptying study reviewed. Emptying is normal. I feel we can reasonably conclude from this nuclear medicine study the patient is less likely to have a mechanical gastric  outlet obstruction or significant, if any, gastroparesis. He could, however, still be harboring significant pathology in his upper GI tract.  From a GI standpoint, I do not feel it is necessary to alter his anticoagulation regimen at this point. I feel it should be continued with a target INR of 3.5-4.5 as suggested by cardiology. Currently, its out of range for a diagnostic EGD.  However, I would offer this gentleman a diagnostic EGD once his INR gets back into the target range. This can be done without any significant increase in risk in bleeding, etc..  Will advance diet this evening. We'll make him n.p.o. past midnight tonight. We'll reassess patient in the a.m.  As far as his colon is concerned, at this point, I do not advocate pursuing a colonoscopy. A conservative approach may be best. He likely suffered a bout of ischemic colitis which is almost always self limiting and not recurrent. An interval followup CT scan in 6-8 weeks could help sort out if needed. Moreover, I doubt a colonoscopy at any point would change management very much.  I discussed with Dr. Ouida Sills via telephone this afternoon. No family present when I came to see the patient this afternoon.  I've also discussed with Scott in the pharmacy. We'll hold off on initiating heparin at this time.  Revision History.Marland KitchenMarland Kitchen

## 2014-03-09 DIAGNOSIS — K559 Vascular disorder of intestine, unspecified: Principal | ICD-10-CM

## 2014-03-09 LAB — CBC
HEMATOCRIT: 34.4 % — AB (ref 39.0–52.0)
Hemoglobin: 11.5 g/dL — ABNORMAL LOW (ref 13.0–17.0)
MCH: 33.8 pg (ref 26.0–34.0)
MCHC: 33.4 g/dL (ref 30.0–36.0)
MCV: 101.2 fL — AB (ref 78.0–100.0)
Platelets: 208 10*3/uL (ref 150–400)
RBC: 3.4 MIL/uL — AB (ref 4.22–5.81)
RDW: 14.4 % (ref 11.5–15.5)
WBC: 6.9 10*3/uL (ref 4.0–10.5)

## 2014-03-09 LAB — PROTIME-INR
INR: 5.69 (ref 0.00–1.49)
Prothrombin Time: 49 seconds — ABNORMAL HIGH (ref 11.6–15.2)

## 2014-03-09 MED ORDER — HYDROCODONE-ACETAMINOPHEN 5-325 MG PO TABS
1.0000 | ORAL_TABLET | Freq: Four times a day (QID) | ORAL | Status: DC | PRN
  Administered 2014-03-09 – 2014-03-10 (×4): 1 via ORAL
  Filled 2014-03-09 (×4): qty 1

## 2014-03-09 NOTE — Progress Notes (Addendum)
Subjective:  Hungry. Wants bacon and eggs. Denies abdominal pain. No recorded vomiting or stools. Discussed with Arline Asp, RN. No vomiting overnight.   Objective: Vital signs in last 24 hours: Temp:  [97.5 F (36.4 C)-97.8 F (36.6 C)] 97.5 F (36.4 C) (04/09 0553) Pulse Rate:  [58-111] 58 (04/09 0553) Resp:  [20] 20 (04/09 0553) BP: (97-108)/(4-65) 100/65 mmHg (04/09 0553) SpO2:  [91 %-99 %] 94 % (04/09 0553) Last BM Date: 03/06/14 General:   Alert,  Well-developed, well-nourished, pleasant and cooperative in NAD Head:  Normocephalic and atraumatic. Eyes:  Sclera clear, no icterus.   Abdomen:  Soft, nontender and nondistended.  Normal bowel sounds, without guarding, and without rebound.   Extremities:  Without clubbing, deformity or edema. Neurologic:  Alert and  oriented x4;  grossly normal neurologically. Skin:  Intact without significant lesions or rashes. Psych:  Alert and cooperative. Normal mood and affect.  Intake/Output from previous day: 04/08 0701 - 04/09 0700 In: 480 [P.O.:480] Out: 900 [Urine:900] Intake/Output this shift:    Lab Results: CBC  Recent Labs  03/07/14 0528 03/08/14 0443 03/09/14 0538  WBC 8.1 8.3 6.9  HGB 11.5* 11.9* 11.5*  HCT 35.2* 36.6* 34.4*  MCV 99.4 100.5* 101.2*  PLT 187 213 208   BMET  Recent Labs  03/07/14 0528  NA 140  K 4.8  CL 105  CO2 24  GLUCOSE 126*  BUN 11  CREATININE 0.97  CALCIUM 9.9   LFTs Lab Results  Component Value Date   ALT 14 03/05/2014   AST 28 03/05/2014   ALKPHOS 52 03/05/2014   BILITOT 1.5* 03/05/2014   Lab Results  Component Value Date   LIPASE 8* 03/05/2014     PT/INR  Recent Labs  03/07/14 0528 03/08/14 0443 03/09/14 0538  LABPROT 33.4* 54.6* 49.0*  INR 3.44* 6.55* 5.69*      Imaging Studies: Ct Abdomen Pelvis Wo Contrast  03/05/2014   CLINICAL DATA:  Diffuse abdominal pain with nausea, vomiting, and diarrhea. Rectal bleeding. Severe back pain.  EXAM: CT ABDOMEN AND PELVIS WITHOUT  CONTRAST  TECHNIQUE: Multidetector CT imaging of the abdomen and pelvis was performed following the standard protocol without intravenous contrast.  COMPARISON:  CT scan dated 07/02/2010  FINDINGS: There is mucosal thickening of the descending and sigmoid portions of the colon with slight pericolonic soft tissue stranding in the sigmoid region. There are multiple diverticula in the sigmoid colon but the appearance is most consistent with colitis and not diverticulitis. There is small amount of fluid in the pericolic gutter adjacent to the area of inflammation of the colon.  The rest of the bowel is normal.  Liver parenchyma, spleen, pancreas, and adrenal glands are normal. There is air in the bile ducts from previous sphincterotomy. Bladder appears normal. No acute osseous abnormalities.  There are extensive vascular calcifications in both kidneys. No hydronephrosis. There are stable bilateral renal cysts. There is extensive calcification in the abdominal aorta and iliac arteries as well as in the origin of the inferior mesenteric artery as well as in the celiac and superior mesenteric arteries and splenic artery.  There small bilateral inguinal hernias containing only fat. Sacroiliac joints are fused.  IMPRESSION: Colitis involving the descending and sigmoid portions of the colon. The possibility of ischemic colitis should be considered.   Electronically Signed   By: Geanie Cooley M.D.   On: 03/05/2014 13:40   Dg Thoracic Spine 2 View  03/03/2014   CLINICAL DATA:  Back pain  EXAM: THORACIC  SPINE - 2 VIEW  COMPARISON:  09/10/2007  FINDINGS: Postsurgical changes are again identified. No compression deformities are seen. No paraspinal mass lesion is noted. Diffuse aortic calcifications are seen.  IMPRESSION: No acute abnormality is noted.   Electronically Signed   By: Alcide Clever M.D.   On: 03/03/2014 16:26   Nm Gastric Emptying  03/08/2014   CLINICAL DATA:  Nausea, early satiety  EXAM: NUCLEAR MEDICINE GASTRIC  EMPTYING SCAN  TECHNIQUE: After oral ingestion of radiolabeled meal, sequential abdominal images were obtained for 120 minutes. Residual percentage of activity remaining within the stomach was calculated at 60 and 120 minutes.  RADIOPHARMACEUTICALS:  2 mCi Technetium-7m labeled sulfur colloid  COMPARISON:  None  FINDINGS: Expected location of the stomach in the left upper quadrant. Ingested meal empties the stomach gradually over the course of the study with 73% retention at 60 min and 20% retention at 120 min (normal retention less than 30% at a 120 min).  IMPRESSION: Normal gastric emptying.   Electronically Signed   By: Ulyses Southward M.D.   On: 03/08/2014 13:37  [2 weeks]   Assessment: 80 year old male admitted with N/V, abdominal pain, anorexia, bloody diarrhea, CT with colitis of descending and sigmoid colon, query ischemic colitis. GI pathogen panel negative. Anticoagulated secondary to mechanical heart valves. GES normal. H/H stable. Patient reports feeling hungry. INR too elevated to do diagnostic EGD. Discussed with Dr. Jena Gauss. Would prefer conservative management if patient continues to improve.    Calcification noted in IMA, celiac and SMA on CT and reviewed with Dr. Tyron Russell. Consider CTA if clinically indicated.   H.pylori serology:negative.   Plan: 1. Continue PPI at home. 2. Advance diet, bariatric. 3. F/u after lunch to reassess progress. Hold on EGD as long as continues to improve. Discussed with Dr. Jena Gauss and Dr. Ouida Sills. All parties in agreement.     LOS: 5 days   Tiffany Kocher  03/09/2014, 7:50 AM   Attending note: Patient seen right after lunch. He knocked out his lunch tray (and breakfast tray as well). Found him sitting in  the chair reading the newspaper. No family present. Discussed progress with nursing staff. Received Norco and Ultracet earlier today for back pain.  At this point, would hold off on endoscopic evaluation. Would continue twice a day PPI therapy and when  necessary Zofran. We will arrange close interval outpatient followup with Dr. Darrick Penna.

## 2014-03-09 NOTE — Progress Notes (Signed)
ANTICOAGULATION CONSULT NOTE  Pharmacy Consult for Coumadin Indication: AVR/MVR + Afib  Allergies  Allergen Reactions  . Codeine Nausea And Vomiting  . Penicillins Nausea And Vomiting   Patient Measurements: Height: 5\' 5"  (165.1 cm) Weight: 150 lb 9.2 oz (68.3 kg) IBW/kg (Calculated) : 61.5  Vital Signs: Temp: 97.5 F (36.4 C) (04/09 0553) Temp src: Oral (04/09 0553) BP: 100/65 mmHg (04/09 0553) Pulse Rate: 58 (04/09 0553)  Labs:  Recent Labs  03/07/14 0528 03/08/14 0443 03/09/14 0538  HGB 11.5* 11.9* 11.5*  HCT 35.2* 36.6* 34.4*  PLT 187 213 208  LABPROT 33.4* 54.6* 49.0*  INR 3.44* 6.55* 5.69*  CREATININE 0.97  --   --    Estimated Creatinine Clearance: 52.8 ml/min (by C-G formula based on Cr of 0.97).  Medical History: Past Medical History  Diagnosis Date  . LV dysfunction      ejection fraction 45-50%  . Rheumatic heart disease      status post St. Jude aortic valve Starr-Edwards mitral valve replacement  . Permanent atrial fibrillation   . Cerebral embolism     Secondary to subtherapeutic INR  . Abdominal aortic aneurysm     small aneurysm  . Hypertension   . Rheumatoid arthritis(714.0)     Severe requiring steroid therapy  . Gastric erosions      resolved  . Cholangitis     Complicated by Escherichia coli bacteremia without endocarditis status post ERCP and sphincterotomy.  . Chronic anticoagulation      INR 3.5-4.5  . Stroke    Medications:  Prescriptions prior to admission  Medication Sig Dispense Refill  . carvedilol (COREG) 3.125 MG tablet Take 3.125 mg by mouth 2 (two) times daily with a meal.      . furosemide (LASIX) 20 MG tablet Take 20-40 mg by mouth 2 (two) times daily. Takes two tablets in the morning and one tablet in the evening      . HYDROcodone-acetaminophen (NORCO/VICODIN) 5-325 MG per tablet Take 0.5-1 tablets by mouth every 6 (six) hours as needed for moderate pain.      06-10-1981 levothyroxine (SYNTHROID, LEVOTHROID) 125 MCG tablet  Take 1 tablet by mouth every morning.       . methocarbamol (ROBAXIN) 500 MG tablet Take 250-500 mg by mouth 2 (two) times daily as needed for muscle spasms.      . Multiple Vitamin (MULTIVITAMIN WITH MINERALS) TABS tablet Take 1 tablet by mouth daily.      . potassium chloride (K-DUR) 10 MEQ tablet Take 1 tablet (10 mEq total) by mouth every evening.  30 tablet  0  . predniSONE (DELTASONE) 5 MG tablet Take 1 tablet by mouth every morning.       . ramipril (ALTACE) 5 MG capsule Take 1 capsule by mouth every morning.       . terazosin (HYTRIN) 2 MG capsule Take 2 mg by mouth every evening.       . traMADol-acetaminophen (ULTRACET) 37.5-325 MG per tablet Take 1 tablet by mouth 4 (four) times daily.       03-14-2000 warfarin (COUMADIN) 5 MG tablet Take 2.5-5 mg by mouth See admin instructions. TAKE 1/2 TABLET (2.5mg  total) on M-W-F and takes one tablet (5mg  total) on all other days       Assessment: 80 yo M on chronic warfarin for mechanical valve + Afib.  Home dose listed above.  INR is slightly above goal range on admission.  He is admitted with anorexia, nausea, & vomiting which  likely explains elevation.  Some n/v persists per report therefore will not be discharged.   On 4/6 case was discussed with Dr. Ouida Sills.  Received orders to discontinue heparin, restart Coumadin.  4/7:  Discharge held up due to n/v.  INR 3.44 4/8:  INR has risen to > 6.  HOLD Coumadin today 4/9:  INR is trending down.  Coumadin will remain on hold pending GI w/u.  Goal of Therapy:  Monitor platelets per anticoagulation protocol: Yes Goal INR of 3-5-4.5 as documented on anticoagulation flowsheet/per MD order   Plan:   HOLD Coumadin today  Daily INR  Emil Weigold A Onofre Gains 03/09/2014,7:57 AM

## 2014-03-09 NOTE — Progress Notes (Signed)
[name], [name]                ACCOUNT NO.:  [id number]  MEDICAL RECORD NO.:  [id number]  LOCATION:                                 FACILITY:  PHYSICIAN:  Kingsley Callander. Ouida Sills, MD       DATE OF BIRTH:  June 17, 1932  DATE OF PROCEDURE:  03/09/2014 DATE OF DISCHARGE:                                PROGRESS NOTE   SUBJECTIVE:  Mr. [name] states that he feels sick this morning.  He does not feel well.  He states he was able to have supper last night without nausea.  He denies abdominal pain.  He denies diarrhea.  OBJECTIVE:  VITAL SIGNS:  Temperature 97.5, pulse 58, respirations 20, blood pressure 100/65, oxygen saturation 94% on room air. LUNGS:  Clear. HEART:  Irregularly irregular with prosthetic valve sounds. ABDOMEN:  Soft, nondistended, and nontender with no palpable organomegaly. EXTREMITIES:  Reveal stable arthritic changes, but no edema. NEURO:  Alert and comfortable appearing.  IMPRESSION/PLAN: 1. Ischemic colitis.  Bowel symptoms appear to have improved     significantly.  Hemoglobin is 11.5, white count is 6.9. 2. Nausea and vomiting, improved.  Gastric emptying study is normal.     Discussed with Dr. Jena Gauss.  Upper endoscopy has been planned,     although he has a high INR again today despite holding Coumadin.     INR is 5.69 which will unfortunately delay upper endoscopy. 3. Status post valve replacements. 4. Rheumatoid arthritis:  Continue physical therapy.  He has     difficulty walking with generalized weakness.     Kingsley Callander. Ouida Sills, MD     ROF/MEDQ  D:  03/09/2014  T:  03/09/2014  Job:  [id number]

## 2014-03-09 NOTE — Progress Notes (Signed)
Nutrition Brief Note  RD pulled to chart due to LOS  Wt Readings from Last 15 Encounters:  03/04/14 150 lb 9.2 oz (68.3 kg)  03/03/14 160 lb (72.576 kg)  10/10/13 159 lb (72.122 kg)  04/06/13 163 lb (73.936 kg)  11/09/12 160 lb (72.576 kg)  08/13/12 160 lb (72.576 kg)  09/29/11 161 lb (73.029 kg)  05/27/11 158 lb (71.668 kg)  01/01/11 155 lb (70.308 kg)  07/03/10 154 lb 12 oz (70.194 kg)  05/08/10 155 lb 12 oz (70.648 kg)  11/12/09 151 lb (68.493 kg)  10/16/08 138 lb (62.596 kg)    Body mass index is 25.06 kg/(m^2). Patient meets criteria for overweight based on current BMI.   Current diet order is bariatric advanced, patient is consuming approximately 50-100% of meals at this time. Labs and medications reviewed.   No nutrition interventions warranted at this time. If nutrition issues arise, please consult RD.   [name], RD, LDN Pager: [phone]

## 2014-03-09 NOTE — Progress Notes (Signed)
Physical Therapy Treatment Patient Details Name: [name] MRN: [id number] DOB: 06/16/1932 Today's Date: 08-Apr-2014       PT Comments    Pt has hx of RA and is limited in his ambulation ability secondary to pain.  Pt able to ambulate with rolling walker x 100 ft with no loss of balance.  Pt appears to be near prior level but will benefit from [facility] to ensure home setting is safe.    Follow Up Recommendations  Home health PT     Equipment Recommendations  None recommended by PT    Recommendations for Other Services  none     Precautions / Restrictions Precautions Precautions: None Restrictions Weight Bearing Restrictions: No    Mobility  Bed Mobility Overal bed mobility: Modified Independent                Transfers Overall transfer level: Modified independent Equipment used: Rolling walker (2 wheeled)                Ambulation/Gait Ambulation/Gait assistance: Modified independent (Device/Increase time) Ambulation Distance (Feet): 100 Feet Assistive device: Rolling walker (2 wheeled)     Gait velocity interpretation: at or above normal speed for age/gender           Cognition Arousal/Alertness: Awake/alert Behavior During Therapy: WFL for tasks assessed/performed Overall Cognitive Status: History of cognitive impairments - at baseline                       Home Living  Lives with wifel                    Prior Function   limited with ambulation secondary to pain         PT Goals (current goals can now be found in the care plan section) Progress towards PT goals: Progressing toward goals    Frequency  Min 3X/week    PT Plan Current plan remains appropriate    Co-evaluation             End of Session Equipment Utilized During Treatment: Gait belt Activity Tolerance: Patient tolerated treatment well Patient left: in chair;with call bell/phone within reach;with chair alarm set     Time: 1530-1551 PT Time  Calculation (min): 21 min  Charges:  $Gait Training: 8-22 mins                    G Codes:      [name] 04/08/14, 3:51 PM

## 2014-03-09 NOTE — Clinical Social Work Psychosocial (Signed)
Clinical Social Work Department BRIEF PSYCHOSOCIAL ASSESSMENT 03/09/2014  Patient:  [name], [name]     Account Number:  [id number]     Admit date:  03/04/2014  Clinical Social Worker:  Wyatt Haste  Date/Time:  03/09/2014 03:26 PM  Referred by:  CSW  Date Referred:  03/09/2014 Referred for  SNF Placement   Other Referral:   Interview type:  Patient Other interview type:   wife- [name]    PSYCHOSOCIAL DATA Living Status:  WIFE Admitted from facility:   Level of care:   Primary support name:  [name] Primary support relationship to patient:  SPOUSE Degree of support available:   supportive    CURRENT CONCERNS Current Concerns  Post-Acute Placement   Other Concerns:    SOCIAL WORK ASSESSMENT / PLAN CSW met with pt and pt's wife at bedside. Pt alert and oriented to self and place, but made several remarks that were not regarding discussion. Pt's wife indicates pt has dementia, although this is not documented in history. Pt lives with his wife of 19 years and is fairly independent at baseline. They have no children or local family. Siblings live a few hours away. He is able to ambulate short distances and did not require a walker or wheelchair per wife's report. She handled all household chores. Pt has severe rheumatoid arthritis. Wife states that they were managing at home fine until this episode. Pt began having diarrhea and bleeding. She became concerned and brought pt to ED to be assessed. Pt's wife is very concerned about her ability to manage pt at home due to recent weakness. She is requesting placement. PT evaluated pt yesterday and recommendation was for home health. Pt was able to transfer. His wife feels that he would benefit from [place] [facility] for rehab before hopefully returning home. CSW discussed Medicare criteria for SNF and she is aware. Requesting that pt be faxed out to have facility determine if skilled need may be appropriate. Agreeable to [place] or [place]. SNF list  provided.   Assessment/plan status:  Psychosocial Support/Ongoing Assessment of Needs Other assessment/ plan:   Information/referral to community resources:   SNF list    PATIENT'S/FAMILY'S RESPONSE TO PLAN OF CARE: Pt's wife feels that pt is not able to return home in current condition. CSW initiated bed search and will follow up with any offers when available.       Benay Pike, [place]

## 2014-03-09 NOTE — Progress Notes (Signed)
CRITICAL VALUE ALERT  Critical value received:  INR 5.69  Date of notification:  03/09/2014   Time of notification:  06:33  Critical value read back:yes  Nurse who received alert:  Cyndia Diver  MD notified (1st page): Karilyn Cota  Time of first page:  06:35  MD notified (2nd page):  Time of second page:  Responding MD:  Karilyn Cota  Time MD responded:  06:37

## 2014-03-09 NOTE — Clinical Social Work Placement (Signed)
Clinical Social Work Department CLINICAL SOCIAL WORK PLACEMENT NOTE 03/09/2014  Patient:  [name], [name]  Account Number:  [id number] Admit date:  03/04/2014  Clinical Social Worker:  Derenda Fennel, LCSW  Date/time:  03/09/2014 03:25 PM  Clinical Social Work is seeking post-discharge placement for this patient at the following level of care:   SKILLED NURSING   (*CSW will update this form in Epic as items are completed)   03/09/2014  Patient/family provided with [facility] Department of Clinical Social Work's list of facilities offering this level of care within the geographic area requested by the patient (or if unable, by the patient's family).  03/09/2014  Patient/family informed of their freedom to choose among providers that offer the needed level of care, that participate in Medicare, Medicaid or managed care program needed by the patient, have an available bed and are willing to accept the patient.  03/09/2014  Patient/family informed of [facility]' ownership interest in [facility], as well as of the fact that they are under no obligation to receive care at this facility.  PASARR submitted to EDS on 03/09/2014 PASARR number received from [employer] on 03/09/2014  FL2 transmitted to all facilities in geographic area requested by pt/family on  03/09/2014 FL2 transmitted to all facilities within larger geographic area on   Patient informed that his/her managed care company has contracts with or will negotiate with  certain facilities, including the following:     Patient/family informed of bed offers received:   Patient chooses bed at  Physician recommends and patient chooses bed at    Patient to be transferred to  on   Patient to be transferred to facility by   The following physician request were entered in Epic:   Additional Comments:  Derenda Fennel, LCSW [phone]

## 2014-03-09 NOTE — Progress Notes (Deleted)
Physical Therapy Treatment Patient Details Name: [name] MRN: [id number] DOB: Sep 14, 1932 Today's Date: March 11, 2014    History of Present Illness past medical history of diastolic heart failure, mechanical aortic and mitral valve on chronic anticoagulation and rheumatoid arthritis who for the last several days has been having nausea with eating and then today started having a number of runs of diarrhea and a few episodes of nausea and vomiting. Following the third episode of diarrhea, patient noted some bright red blood in his stool and came into the emergency room    PT Comments    Pt able to don socks I. Able to wash hands at sink and ambulate for 180 feet with rolling walker pt disposition to home with [facility] still is appropriate.  Follow Up Recommendations  Home health PT     Equipment Recommendations  None recommended by PT    Recommendations for Other Services       Precautions / Restrictions Precautions Precautions: None Restrictions Weight Bearing Restrictions: No    Mobility  Bed Mobility Overal bed mobility: Independent                Transfers Overall transfer level: Modified independent Equipment used: Rolling walker (2 wheeled)                Ambulation/Gait Ambulation/Gait assistance: Modified independent (Device/Increase time) Ambulation Distance (Feet): 180 Feet Assistive device: Rolling walker (2 wheeled)     Gait velocity interpretation: at or above normal speed for age/gender            Cognition Arousal/Alertness: Awake/alert Behavior During Therapy: [facility] for tasks assessed/performed Overall Cognitive Status: Within Functional Limits for tasks assessed                                      PT Goals (current goals can now be found in the care plan section) Acute Rehab PT Goals Time For Goal Achievement: 03/10/14 Potential to Achieve Goals: Fair Progress towards PT goals: Progressing toward goals    Frequency  Min 3X/week    PT Plan Current plan remains appropriate       End of Session Equipment Utilized During Treatment: Gait belt Activity Tolerance: Patient tolerated treatment well Patient left: in chair;with call bell/phone within reach;with chair alarm set     Time: 7353-2992 PT Time Calculation (min): 15 min  Charges:  $Gait Training: 8-22 mins                    G Codes:      [name] 2014-03-11, 4:17 PM

## 2014-03-10 ENCOUNTER — Telehealth: Payer: Self-pay | Admitting: Gastroenterology

## 2014-03-10 LAB — CBC
HEMATOCRIT: 37.2 % — AB (ref 39.0–52.0)
Hemoglobin: 12.2 g/dL — ABNORMAL LOW (ref 13.0–17.0)
MCH: 32.9 pg (ref 26.0–34.0)
MCHC: 32.8 g/dL (ref 30.0–36.0)
MCV: 100.3 fL — AB (ref 78.0–100.0)
Platelets: 241 10*3/uL (ref 150–400)
RBC: 3.71 MIL/uL — AB (ref 4.22–5.81)
RDW: 14.5 % (ref 11.5–15.5)
WBC: 8 10*3/uL (ref 4.0–10.5)

## 2014-03-10 LAB — PROTIME-INR
INR: 5.24 (ref 0.00–1.49)
Prothrombin Time: 46 seconds — ABNORMAL HIGH (ref 11.6–15.2)

## 2014-03-10 MED ORDER — WARFARIN SODIUM 5 MG PO TABS: ORAL_TABLET | ORAL | Status: AC

## 2014-03-10 NOTE — Discharge Summary (Signed)
NAMEEVO, ADERMAN                ACCOUNT NO.:  [id number]  MEDICAL RECORD NO.:  [id number]  LOCATION:  A315                          FACILITY:  [facility]  PHYSICIAN:  Kingsley Callander. Ouida Sills, MD       DATE OF BIRTH:  [birth year]  DATE OF ADMISSION:  03/04/2014 DATE OF DISCHARGE:  [facility]                              DISCHARGE SUMMARY   ADDENDUM:  Mr. Arriaga' discharge was delayed because of additional nausea and inability to eat.  The symptoms have now resolved.  He has had a gastric emptying study, which was normal.  He has had advancement of his diet which he has tolerated well and has been able to walk with Physical Therapy.  His wife unfortunately is unable to care for him at home now.  Arrangements are being made for transfer to skilled nursing.  He has had persistent elevation of his INR.  Coumadin has been held. His INR is now 5.24. Coumadin will be held again today.  Please obtain an INR on Saturday, March 11, 2014, and notify our on-call physician. Coumadin should be restarted when his INR reaches 4.5 or lower.  He will then be restarted at 2.5 mg daily and will have followup of his Coumadin through the Cardiology Clinic.  He had no further bleeding.  His hemoglobin is 12.2.  His white count is normal at 8000.  His ischemic colitis symptoms have resolved.  He has continued to experience musculoskeletal pain, which has been treated with Ultracef or Norco.  FOLLOWUP:  The patient will need to have followup at a skilled nursing facility following discharge.  He will need to have a followup INR on Monday through the Cardiology Clinic.     Kingsley Callander. Ouida Sills, MD     ROF/MEDQ  D:  03/10/2014  T:  03/10/2014  Job:  [id number]

## 2014-03-10 NOTE — Telephone Encounter (Signed)
[facility] follow up with Dr. Darrick Penna per Dr. Lyna Poser.

## 2014-03-10 NOTE — Clinical Social Work Note (Signed)
CSW spoke with pt's wife as pt has been d/c. Yesterday, pt had not ambulated and she felt she could not handle him at home. PT worked with pt yesterday afternoon and wife said she could not stay. He was able to walk 180'. CSW notified pt's wife of this and that recommendation is for home health as this is pt's baseline. She states she will be up here soon and will take pt home. CM discussed home health. CSW will sign off.  Derenda Fennel, [place] [phone]

## 2014-03-10 NOTE — Progress Notes (Signed)
ANTICOAGULATION CONSULT NOTE  Pharmacy Consult for Coumadin Indication: AVR/MVR + Afib  Allergies  Allergen Reactions  . Codeine Nausea And Vomiting  . Penicillins Nausea And Vomiting   Patient Measurements: Height: 5\' 5"  (165.1 cm) Weight: 150 lb 9.2 oz (68.3 kg) IBW/kg (Calculated) : 61.5  Vital Signs: Temp: 97.6 F (36.4 C) (04/10 0656) Temp src: Oral (04/10 0656) BP: 130/78 mmHg (04/10 0656) Pulse Rate: 74 (04/10 0656)  Labs:  Recent Labs  03/08/14 0443 03/09/14 0538 03/10/14 0528  HGB 11.9* 11.5* 12.2*  HCT 36.6* 34.4* 37.2*  PLT 213 208 241  LABPROT 54.6* 49.0* 46.0*  INR 6.55* 5.69* 5.24*   Estimated Creatinine Clearance: 52.8 ml/min (by C-G formula based on Cr of 0.97).  Medical History: Past Medical History  Diagnosis Date  . LV dysfunction      ejection fraction 45-50%  . Rheumatic heart disease      status post St. Jude aortic valve Starr-Edwards mitral valve replacement  . Permanent atrial fibrillation   . Cerebral embolism     Secondary to subtherapeutic INR  . Abdominal aortic aneurysm     small aneurysm  . Hypertension   . Rheumatoid arthritis(714.0)     Severe requiring steroid therapy  . Gastric erosions      resolved  . Cholangitis     Complicated by Escherichia coli bacteremia without endocarditis status post ERCP and sphincterotomy.  . Chronic anticoagulation      INR 3.5-4.5  . Stroke    Medications:  Prescriptions prior to admission  Medication Sig Dispense Refill  . carvedilol (COREG) 3.125 MG tablet Take 3.125 mg by mouth 2 (two) times daily with a meal.      . furosemide (LASIX) 20 MG tablet Take 20-40 mg by mouth 2 (two) times daily. Takes two tablets in the morning and one tablet in the evening      . HYDROcodone-acetaminophen (NORCO/VICODIN) 5-325 MG per tablet Take 0.5-1 tablets by mouth every 6 (six) hours as needed for moderate pain.      06-10-1979 levothyroxine (SYNTHROID, LEVOTHROID) 125 MCG tablet Take 1 tablet by mouth every  morning.       . methocarbamol (ROBAXIN) 500 MG tablet Take 250-500 mg by mouth 2 (two) times daily as needed for muscle spasms.      . Multiple Vitamin (MULTIVITAMIN WITH MINERALS) TABS tablet Take 1 tablet by mouth daily.      . potassium chloride (K-DUR) 10 MEQ tablet Take 1 tablet (10 mEq total) by mouth every evening.  30 tablet  0  . predniSONE (DELTASONE) 5 MG tablet Take 1 tablet by mouth every morning.       . ramipril (ALTACE) 5 MG capsule Take 1 capsule by mouth every morning.       . terazosin (HYTRIN) 2 MG capsule Take 2 mg by mouth every evening.       . traMADol-acetaminophen (ULTRACET) 37.5-325 MG per tablet Take 1 tablet by mouth 4 (four) times daily.       . [DISCONTINUED] warfarin (COUMADIN) 5 MG tablet Take 2.5-5 mg by mouth See admin instructions. TAKE 1/2 TABLET (2.5mg  total) on M-W-F and takes one tablet (5mg  total) on all other days       Assessment: 80 yo M on chronic warfarin for mechanical valve + Afib.  Home dose listed above.  INR is slightly above goal range on admission.  He is admitted with anorexia, nausea, & vomiting which likely explains elevation.  Some n/v persists per  report therefore will not be discharged.   On 4/6 case was discussed with Dr. Ouida Sills.  Received orders to discontinue heparin, restart Coumadin.  4/7:  Discharge held up due to n/v.  INR 3.44 4/8:  INR has risen to > 6.  HOLD Coumadin today INR is trending down but still above target.  Coumadin will remain on hold until INR </= 4.5 per MD orders.  Goal of Therapy:  Monitor platelets per anticoagulation protocol: Yes Goal INR of 3-5-4.5 as documented on anticoagulation flowsheet/per MD order   Plan:   HOLD Coumadin today  Daily INR  Dalissa Lovin A Manuel Lawhead 03/10/2014,8:53 AM

## 2014-03-10 NOTE — Progress Notes (Signed)
Patient to discharge to [facility].  IV removed - WNL and patient dressed in own clothing.  Report called and given to [facility] at facility.  Awaiting arrival of patients family for transport.  Packet ready per SW.

## 2014-03-10 NOTE — Clinical Social Work Placement (Signed)
Clinical Social Work Department CLINICAL SOCIAL WORK PLACEMENT NOTE 03/10/2014  Patient:  [name], [name]  Account Number:  [id number] Admit date:  03/04/2014  Clinical Social Worker:  Derenda Fennel, LCSW  Date/time:  03/09/2014 03:25 PM  Clinical Social Work is seeking post-discharge placement for this patient at the following level of care:   SKILLED NURSING   (*CSW will update this form in Epic as items are completed)   03/09/2014  Patient/family provided with [facility] Department of Clinical Social Work's list of facilities offering this level of care within the geographic area requested by the patient (or if unable, by the patient's family).  03/09/2014  Patient/family informed of their freedom to choose among providers that offer the needed level of care, that participate in Medicare, Medicaid or managed care program needed by the patient, have an available bed and are willing to accept the patient.  03/09/2014  Patient/family informed of [facility]' ownership interest in [facility], as well as of the fact that they are under no obligation to receive care at this facility.  PASARR submitted to EDS on 03/09/2014 PASARR number received from EDS on 03/09/2014  FL2 transmitted to all facilities in geographic area requested by pt/family on  03/09/2014 FL2 transmitted to all facilities within larger geographic area on   Patient informed that his/her managed care company has contracts with or will negotiate with  certain facilities, including the following:     Patient/family informed of bed offers received:  03/10/2014 Patient chooses bed at [facility] Physician recommends and patient chooses bed at  [facility]  Patient to be transferred to [facility] on  03/10/2014 Patient to be transferred to facility by family  The following physician request were entered in Epic:   Additional Comments:  Derenda Fennel, LCSW [phone]

## 2014-03-10 NOTE — Clinical Social Work Note (Signed)
CSW received call from [facility] stating that a family member called them begging for pt to be able to be admitted there. Elease Hashimoto at [facility] states they can take pt and would do an evaluation, but would likely be private pay. CSW notified pt's wife of this. She was tearful and worried that Dr. Ouida Sills would not see her husband again. She states she is going to take him home. Pt's brother and sister-in-law are picking pt up later today. CSW asked wife to please notify them of her decision so they will understand this is what she decided.  Derenda Fennel, [place] [phone]

## 2014-03-10 NOTE — Clinical Social Work Note (Signed)
Pt's wife has called and decided to agree to potential private pay at [facility]. Facility notified and agreeable. D/C summary faxed. Per MD, use medications from d/c summary several days ago. Only change is coumadin which is dictated in addendum today. Facility aware. Family to provide transport. MD notified of change in plans.   Derenda Fennel, [place] [phone]

## 2014-03-13 ENCOUNTER — Encounter: Payer: Self-pay | Admitting: Gastroenterology

## 2014-03-13 NOTE — Telephone Encounter (Signed)
Pt is aware of OV on 5/27 at 10 am with SF and appt card mailed

## 2014-04-14 ENCOUNTER — Ambulatory Visit (INDEPENDENT_AMBULATORY_CARE_PROVIDER_SITE_OTHER): Payer: Medicare Other | Admitting: Cardiovascular Disease

## 2014-04-14 ENCOUNTER — Encounter: Payer: Self-pay | Admitting: Cardiovascular Disease

## 2014-04-14 VITALS — BP 97/59 | HR 86 | Ht 71.0 in | Wt 149.0 lb

## 2014-04-14 DIAGNOSIS — I5022 Chronic systolic (congestive) heart failure: Secondary | ICD-10-CM

## 2014-04-14 DIAGNOSIS — I6350 Cerebral infarction due to unspecified occlusion or stenosis of unspecified cerebral artery: Secondary | ICD-10-CM

## 2014-04-14 DIAGNOSIS — Z954 Presence of other heart-valve replacement: Secondary | ICD-10-CM

## 2014-04-14 DIAGNOSIS — I4891 Unspecified atrial fibrillation: Secondary | ICD-10-CM

## 2014-04-14 DIAGNOSIS — I359 Nonrheumatic aortic valve disorder, unspecified: Secondary | ICD-10-CM

## 2014-04-14 DIAGNOSIS — I519 Heart disease, unspecified: Secondary | ICD-10-CM

## 2014-04-14 DIAGNOSIS — I4821 Permanent atrial fibrillation: Secondary | ICD-10-CM

## 2014-04-14 DIAGNOSIS — I099 Rheumatic heart disease, unspecified: Secondary | ICD-10-CM

## 2014-04-14 DIAGNOSIS — E785 Hyperlipidemia, unspecified: Secondary | ICD-10-CM

## 2014-04-14 DIAGNOSIS — Z8679 Personal history of other diseases of the circulatory system: Secondary | ICD-10-CM

## 2014-04-14 DIAGNOSIS — I059 Rheumatic mitral valve disease, unspecified: Secondary | ICD-10-CM

## 2014-04-14 MED ORDER — LISINOPRIL 5 MG PO TABS: 5.0000 mg | ORAL_TABLET | Freq: Every day | ORAL | Status: AC

## 2014-04-14 NOTE — Progress Notes (Signed)
Patient ID: [name], male   DOB: [birth year], 80 y.o.   MRN: [id number]      SUBJECTIVE: [name] is an 80 yo man with a history of rheumatic heart disease with mechanical aortic and mitral valves and history of cerebral embolism with INR around 3, necessitating an INR goal of 3.5-4.5, who presents for follow-up along with his wife. He also has rheumatoid arthritis and severe PAD.  He was hospitalized for nausea, vomiting, diarrhea, ischemic colitis of the descending and sigmoid colon with a GI bleed in April. Initially a colonoscopy was planned but ultimately was never pursued as it was felt it would not change the course of management significantly. Hemoglobin on 03/10/2014 was 12.2.  He denies abdominal pain. He is currently residing at the [facility] and receiving physical therapy. He denies chest pain and shortness of breath. He has been using a recumbent bicycle.    PMH: 1. Rheumatoid arthritis: on prednisone.  2. Dementia  3. Rheumatic heart disease: St Jude AVR, Starr-Edwards MVR. Echo (3/11) with mild LV dilation, EF 40-45%, mechanical MV with mean 5 mmHg, mechanical AoV mean gradient 9 mmHg, RV with normal systolic function, PA systolic pressure 37 mmHg. Echo (9/13): EF 45%, moderate LVH, mildly dilated LV, St Jude mechanical aortic valve with mean gradient 8, Starr-Edwards mechanical mitral valve with mean gradient 4 mmHg, moderate to severe LAE, normal RV.  4. Permanent atrial fibrillation  5. Cerebral embolic event with INR apparently around 3. Current INR goal has been 3.5-4.5.  6. PAD: Severe distal disease, unlikely to be amenable to intervention. Plan has been conservative treatment unless life-threatening limb ischemia.  7. Hyperlipidemia: Myalgias with multiple statins.  SH: Married, lives in [place], no smoking.  FH: No premature CAD.      Allergies  Allergen Reactions  . Codeine Nausea And Vomiting  . Penicillins Nausea And Vomiting    Current  Outpatient Prescriptions  Medication Sig Dispense Refill  . HYDROcodone-acetaminophen (NORCO/VICODIN) 5-325 MG per tablet Take 0.5-1 tablets by mouth every 6 (six) hours as needed for moderate pain.      Marland Kitchen lisinopril (PRINIVIL,ZESTRIL) 10 MG tablet Take 10 mg by mouth daily.      . methocarbamol (ROBAXIN) 500 MG tablet Take 250 mg by mouth 2 (two) times daily as needed for muscle spasms.       Marland Kitchen warfarin (COUMADIN) 5 MG tablet 2.5 mg daily but hold today 4/10  2.5 tablet  12  . carvedilol (COREG) 3.125 MG tablet Take 3.125 mg by mouth 2 (two) times daily with a meal.      . furosemide (LASIX) 20 MG tablet Take 40 mg by mouth daily.       Marland Kitchen levothyroxine (SYNTHROID, LEVOTHROID) 125 MCG tablet Take 1 tablet by mouth every morning.       . Multiple Vitamin (MULTIVITAMIN WITH MINERALS) TABS tablet Take 1 tablet by mouth daily.      . potassium chloride (K-DUR) 10 MEQ tablet Take 1 tablet (10 mEq total) by mouth every evening.  30 tablet  0  . predniSONE (DELTASONE) 5 MG tablet Take 1 tablet by mouth every morning.       . ramipril (ALTACE) 5 MG capsule Take 1 capsule by mouth every morning.       . terazosin (HYTRIN) 2 MG capsule Take 2 mg by mouth every evening.       . traMADol-acetaminophen (ULTRACET) 37.5-325 MG per tablet Take 1 tablet by mouth 4 (four) times  daily.        No current facility-administered medications for this visit.    Past Medical History  Diagnosis Date  . LV dysfunction      ejection fraction 45-50%  . Rheumatic heart disease      status post St. Jude aortic valve Starr-Edwards mitral valve replacement  . Permanent atrial fibrillation   . Cerebral embolism     Secondary to subtherapeutic INR  . Abdominal aortic aneurysm     small aneurysm  . Hypertension   . Rheumatoid arthritis(714.0)     Severe requiring steroid therapy  . Gastric erosions      resolved  . Cholangitis     Complicated by Escherichia coli bacteremia without endocarditis status post ERCP and  sphincterotomy.  . Chronic anticoagulation      INR 3.5-4.5  . Stroke     No past surgical history on file.  History   Social History  . Marital Status: Married    Spouse Name: N/A    Number of Children: N/A  . Years of Education: N/A   Occupational History  . Not on file.   Social History Main Topics  . Smoking status: Former Smoker -- 1.00 packs/day for 25 years    Types: Cigarettes    Quit date: 12/01/1973  . Smokeless tobacco: Never Used  . Alcohol Use: Not on file  . Drug Use: Not on file  . Sexual Activity: Not on file   Other Topics Concern  . Not on file   Social History Narrative  . No narrative on file     Filed Vitals:   04/14/14 1256  Height: 5\' 11"  (1.803 m)  Weight: 149 lb (67.586 kg)   BP 97/59 Pulse 86   PHYSICAL EXAM General: NAD  Neck: JVP 8 cm, no thyromegaly or thyroid nodule.  Lungs: Clear to auscultation bilaterally with normal respiratory effort.  CV: Nondisplaced PMI. Heart regular rate,irregular rhythm, mechanical S1/S2, no S3/S4, 1/6 SEM. 1+ ankle edema bilaterally. Unable to palpate pedal pulses.  Abdomen: Soft, nontender, no hepatosplenomegaly, no distention.  Neurologic: Alert and oriented x 3.  Psych: Normal to flat affect.  Extremities: Disfigured, contractures.   ECG: reviewed and available in electronic records.      ASSESSMENT AND PLAN: 1. Mechanical aortic and mitral valves: INR goal has been 3.5-4.5 due to CVA with INR 3. He has tolerated this INR goal for several years now with no bleeding problems. Will continue. He should continue ASA 81 mg daily. Valves were functioning normally on last echo.  2. Chronic systolic CHF: EF which is stable compared to the past.  - Will reduce lisinopril to 5 mg daily given low BP, and continue Coreg and Lasix 40 qam, 20 qpm.  3. Atrial fibrillation: Chronic, with good rate control. Continue warfarin.  4. PAD: No significant leg pain though there is severe distal disease. Plan  for medical management unless life-threatening limb ischemia develops. He cannot tolerate statins due to myalgias.   Dispo: f/u 4 months.    28%, M.D., F.A.C.C.

## 2014-04-14 NOTE — Patient Instructions (Signed)
   Decrease Lisinopril to 5mg  daily Continue all current medications. Follow up in  4 months

## 2014-04-26 ENCOUNTER — Ambulatory Visit: Payer: Medicare Other | Admitting: Gastroenterology

## 2014-04-26 ENCOUNTER — Encounter: Payer: Self-pay | Admitting: Vascular Surgery

## 2014-04-27 ENCOUNTER — Encounter: Payer: Self-pay | Admitting: Vascular Surgery

## 2014-04-27 ENCOUNTER — Ambulatory Visit (INDEPENDENT_AMBULATORY_CARE_PROVIDER_SITE_OTHER): Payer: Medicare Other | Admitting: Vascular Surgery

## 2014-04-27 VITALS — BP 101/55 | HR 63 | Temp 98.0°F | Ht 71.0 in | Wt 148.0 lb

## 2014-04-27 DIAGNOSIS — L98499 Non-pressure chronic ulcer of skin of other sites with unspecified severity: Principal | ICD-10-CM

## 2014-04-27 DIAGNOSIS — I7025 Atherosclerosis of native arteries of other extremities with ulceration: Secondary | ICD-10-CM | POA: Insufficient documentation

## 2014-04-27 DIAGNOSIS — I739 Peripheral vascular disease, unspecified: Secondary | ICD-10-CM

## 2014-04-27 NOTE — Progress Notes (Signed)
[facility] [place] HISTORY AND PHYSICAL   History of Present Illness:  Patient is a 80 y.o. year old male who presents for evaluation of a nonhealing ulcer on his left foot. The patient has had intermittent exacerbations and remissions of wound on his left foot since April 2015. He is not very ambulatory due to to severe rheumatoid arthritis. He also has slowly worsening dementia. He resides in a skilled nursing facility. He is a former smoker but quit 30 years ago. He denies history of diabetes or hypertension or elevated cholesterol. He previously had aortic and mitral valve replacement with mechanical valves. He is on chronic Coumadin. He has permanent atrial fibrillation. He is on chronic steroids for his rheumatoid condition.  Past Medical History  Diagnosis Date  . LV dysfunction      ejection fraction 45-50%  . Rheumatic heart disease      status post St. Jude aortic valve Starr-Edwards mitral valve replacement  . Permanent atrial fibrillation   . Cerebral embolism     Secondary to subtherapeutic INR  . Abdominal aortic aneurysm     small aneurysm  . Hypertension   . Rheumatoid arthritis(714.0)     Severe requiring steroid therapy  . Gastric erosions      resolved  . Cholangitis     Complicated by Escherichia coli bacteremia without endocarditis status post ERCP and sphincterotomy.  . Chronic anticoagulation      INR 3.5-4.5  . Stroke     No past surgical history on file.  Social History History  Substance Use Topics  . Smoking status: Former Smoker -- 1.00 packs/day for 25 years    Types: Cigarettes    Quit date: 12/01/1973  . Smokeless tobacco: Never Used  . Alcohol Use: Not on file    Family History No family history on file.  Allergies  Allergies  Allergen Reactions  . Codeine Nausea And Vomiting  . Penicillins Nausea And Vomiting     Current Outpatient Prescriptions  Medication Sig Dispense Refill  . calcium-vitamin D (OSCAL  500/200 D-3) 500-200 MG-UNIT per tablet Take 1 tablet by mouth 2 (two) times daily.      . carvedilol (COREG) 3.125 MG tablet Take 3.125 mg by mouth 2 (two) times daily with a meal.      . furosemide (LASIX) 20 MG tablet Take 40 mg by mouth every morning. & 20 mg in the evening      . HYDROcodone-acetaminophen (NORCO/VICODIN) 5-325 MG per tablet Take 0.5-1 tablets by mouth every 6 (six) hours as needed for moderate pain.      Marland Kitchen levothyroxine (SYNTHROID, LEVOTHROID) 125 MCG tablet Take 1 tablet by mouth every morning.       Marland Kitchen lisinopril (PRINIVIL,ZESTRIL) 5 MG tablet Take 1 tablet (5 mg total) by mouth daily.      . methocarbamol (ROBAXIN) 500 MG tablet Take 250 mg by mouth 2 (two) times daily as needed for muscle spasms.       . Multiple Vitamin (MULTIVITAMIN WITH MINERALS) TABS tablet Take 1 tablet by mouth daily.      . polyethylene glycol (MIRALAX / GLYCOLAX) packet Take 17 g by mouth daily.      . potassium chloride (K-DUR) 10 MEQ tablet Take 1 tablet (10 mEq total) by mouth every evening.  30 tablet  0  . predniSONE (DELTASONE) 5 MG tablet Take 1 tablet by mouth every morning.       . terazosin (HYTRIN) 2 MG capsule Take  2 mg by mouth every evening.       . traMADol-acetaminophen (ULTRACET) 37.5-325 MG per tablet Take 1 tablet by mouth 4 (four) times daily.       Marland Kitchen warfarin (COUMADIN) 5 MG tablet 2.5 mg daily but hold today 4/10  2.5 tablet  12   No current facility-administered medications for this visit.    ROS:   General:  No weight loss, Fever, chills  HEENT: No recent headaches, no nasal bleeding, no visual changes, no sore throat  Neurologic: No dizziness, blackouts, seizures. No recent symptoms of stroke or mini- stroke. No recent episodes of slurred speech, or temporary blindness.  Cardiac: No recent episodes of chest pain/pressure, no shortness of breath at rest.  No shortness of breath with exertion. + history of atrial fibrillation or irregular heartbeat  Vascular: No  history of rest pain in feet.  No history of claudication.  + history of non-healing ulcer, No history of DVT   Pulmonary: No home oxygen, no productive cough, no hemoptysis,  No asthma or wheezing  Musculoskeletal:  [x ] Arthritis, [ ]  Low back pain,  [x ] Joint pain  Hematologic:No history of hypercoagulable state.  No history of easy bleeding.  No history of anemia  Gastrointestinal: No hematochezia or melena,  No gastroesophageal reflux, no trouble swallowing  Urinary: [ ]  chronic Kidney disease, [ ]  on HD - [ ]  MWF or [ ]  TTHS, [ ]  Burning with urination, [ ]  Frequent urination, [ ]  Difficulty urinating;   Skin: No rashes  Psychological: No history of anxiety,  No history of depression   Physical Examination  Filed Vitals:   04/27/14 1310  BP: 101/55  Pulse: 63  Temp: 98 F (36.7 C)  TempSrc: Oral  Height: 5\' 11"  (1.803 m)  Weight: 148 lb (67.132 kg)  SpO2: 96%    Body mass index is 20.65 kg/(m^2).  General:  Alert and oriented, no acute distress HEENT: Normal Neck: No bruit or JVD Pulmonary: Clear to auscultation bilaterally Cardiac: Regular Rate and Rhythm without murmur Abdomen: Soft, non-tender, non-distended, no mass Skin: No rash, 2 cm ulceration with erythema in the left medial aspect of the foot, ulcer is over the metatarsal head Extremity Pulses:  2+ radial, brachial, femoral, dorsalis pedis, posterior tibial pulses bilaterally Musculoskeletal: No deformity or edema  Neurologic: Upper and lower extremity motor 5/5 and symmetric  DATA:  Previous ABIs from [place] were reviewed today. Vessels in the right side were calcified left side was 0.26   ASSESSMENT:  Nonhealing wound left lower extremity. The patient is not a very good open revascularization candidate. However he did discuss with the wife today that we could do a diagnostic arteriogram and possible percutaneous intervention if he would tolerate fairly well. We will need to bridges Coumadin since he  has mechanical valves. We will involve [facility] for coordination of this.   PLAN:  Aortogram bilateral lower extremity runoff possible interventions June 27. If the patient has deterioration of his foot we will schedule this sooner.  , MD [facility] [place] Office: [phone] Pager: [phone]

## 2014-04-28 ENCOUNTER — Encounter: Payer: Self-pay | Admitting: Vascular Surgery

## 2014-04-28 ENCOUNTER — Other Ambulatory Visit: Payer: Self-pay

## 2014-05-02 ENCOUNTER — Telehealth: Payer: Self-pay

## 2014-05-02 NOTE — Telephone Encounter (Signed)
Patient past away @ [facility] per Ileene Hutchinson

## 2014-05-26 ENCOUNTER — Encounter (HOSPITAL_COMMUNITY): Admission: RE | Payer: Self-pay | Source: Ambulatory Visit

## 2014-05-26 ENCOUNTER — Ambulatory Visit (HOSPITAL_COMMUNITY): Admission: RE | Admit: 2014-05-26 | Payer: Medicare Other | Source: Ambulatory Visit | Admitting: Vascular Surgery

## 2014-05-26 SURGERY — ABDOMINAL AORTAGRAM
Anesthesia: LOCAL

## 2014-05-31 DEATH — deceased

## 2014-08-24 ENCOUNTER — Ambulatory Visit: Payer: Medicare Other | Admitting: Cardiovascular Disease
